# Patient Record
Sex: Male | Born: 1935 | Race: White | Hispanic: No | Marital: Married | Smoking: Never smoker
Health system: Southern US, Community
[De-identification: ages and names within clinical notes are randomized; demographics above are authoritative.]

## PROBLEM LIST (undated history)

## (undated) DIAGNOSIS — L729 Follicular cyst of the skin and subcutaneous tissue, unspecified: Principal | ICD-10-CM

## (undated) DIAGNOSIS — D72820 Lymphocytosis (symptomatic): Principal | ICD-10-CM

## (undated) DIAGNOSIS — L089 Local infection of the skin and subcutaneous tissue, unspecified: Secondary | ICD-10-CM

## (undated) HISTORY — DX: Lymphocytosis (symptomatic): D72.820

## (undated) HISTORY — DX: Follicular cyst of the skin and subcutaneous tissue, unspecified: L72.9

## (undated) HISTORY — PX: COLONOSCOPY: SHX174

## (undated) HISTORY — DX: Local infection of the skin and subcutaneous tissue, unspecified: L08.9

---

## 2012-05-25 DIAGNOSIS — J3489 Other specified disorders of nose and nasal sinuses: Secondary | ICD-10-CM | POA: Diagnosis not present

## 2012-12-14 DIAGNOSIS — Z23 Encounter for immunization: Secondary | ICD-10-CM | POA: Diagnosis not present

## 2012-12-14 DIAGNOSIS — Z131 Encounter for screening for diabetes mellitus: Secondary | ICD-10-CM | POA: Diagnosis not present

## 2012-12-14 DIAGNOSIS — Z0000 Encounter for general adult medical examination without abnormal findings: Secondary | ICD-10-CM | POA: Diagnosis not present

## 2013-04-25 DIAGNOSIS — H2510 Age-related nuclear cataract, unspecified eye: Secondary | ICD-10-CM | POA: Diagnosis not present

## 2013-11-19 DIAGNOSIS — R109 Unspecified abdominal pain: Secondary | ICD-10-CM | POA: Diagnosis not present

## 2013-11-19 DIAGNOSIS — K625 Hemorrhage of anus and rectum: Secondary | ICD-10-CM | POA: Diagnosis not present

## 2013-11-21 DIAGNOSIS — R197 Diarrhea, unspecified: Secondary | ICD-10-CM | POA: Diagnosis not present

## 2013-11-21 DIAGNOSIS — K921 Melena: Secondary | ICD-10-CM | POA: Diagnosis not present

## 2013-11-21 DIAGNOSIS — K219 Gastro-esophageal reflux disease without esophagitis: Secondary | ICD-10-CM | POA: Diagnosis not present

## 2013-11-22 DIAGNOSIS — K921 Melena: Secondary | ICD-10-CM | POA: Diagnosis not present

## 2013-11-23 DIAGNOSIS — D72829 Elevated white blood cell count, unspecified: Secondary | ICD-10-CM | POA: Diagnosis not present

## 2013-11-23 DIAGNOSIS — L723 Sebaceous cyst: Secondary | ICD-10-CM | POA: Diagnosis not present

## 2013-11-25 ENCOUNTER — Telehealth: Payer: Self-pay | Admitting: Hematology and Oncology

## 2013-11-25 NOTE — Telephone Encounter (Signed)
NEW PATIENT SCHEDULED FOR 02/24 @ 10:45 W/DR. Hope.  REFERRING DR. Harrington Challenger DX- NEW DX LYMPHOCYTOSIS.

## 2013-11-25 NOTE — Telephone Encounter (Signed)
C/D 11/25/13 for appt. 11/26/13

## 2013-11-26 ENCOUNTER — Other Ambulatory Visit (HOSPITAL_COMMUNITY)
Admission: RE | Admit: 2013-11-26 | Discharge: 2013-11-26 | Disposition: A | Discharge status: Home / Self Care | Payer: Medicare Other | Source: Ambulatory Visit | Attending: Hematology and Oncology | Admitting: Hematology and Oncology

## 2013-11-26 ENCOUNTER — Ambulatory Visit (HOSPITAL_BASED_OUTPATIENT_CLINIC_OR_DEPARTMENT_OTHER): Payer: Medicare Other

## 2013-11-26 ENCOUNTER — Ambulatory Visit (HOSPITAL_BASED_OUTPATIENT_CLINIC_OR_DEPARTMENT_OTHER): Payer: Medicare Other | Admitting: Hematology and Oncology

## 2013-11-26 ENCOUNTER — Encounter: Payer: Self-pay | Admitting: Hematology and Oncology

## 2013-11-26 ENCOUNTER — Encounter (INDEPENDENT_AMBULATORY_CARE_PROVIDER_SITE_OTHER): Payer: Self-pay

## 2013-11-26 ENCOUNTER — Telehealth: Payer: Self-pay | Admitting: *Deleted

## 2013-11-26 ENCOUNTER — Ambulatory Visit: Payer: Medicare Other

## 2013-11-26 VITALS — BP 154/86 | HR 84 | Temp 97.6°F | Resp 20 | Ht 72.0 in | Wt 205.2 lb

## 2013-11-26 DIAGNOSIS — D72820 Lymphocytosis (symptomatic): Secondary | ICD-10-CM | POA: Insufficient documentation

## 2013-11-26 DIAGNOSIS — L989 Disorder of the skin and subcutaneous tissue, unspecified: Secondary | ICD-10-CM | POA: Diagnosis not present

## 2013-11-26 DIAGNOSIS — K922 Gastrointestinal hemorrhage, unspecified: Secondary | ICD-10-CM | POA: Diagnosis not present

## 2013-11-26 DIAGNOSIS — D539 Nutritional anemia, unspecified: Secondary | ICD-10-CM

## 2013-11-26 DIAGNOSIS — C9110 Chronic lymphocytic leukemia of B-cell type not having achieved remission: Secondary | ICD-10-CM | POA: Diagnosis not present

## 2013-11-26 HISTORY — DX: Lymphocytosis (symptomatic): D72.820

## 2013-11-26 LAB — CBC & DIFF AND RETIC
BASO%: 0.3 % (ref 0.0–2.0)
Basophils Absolute: 0.1 10*3/uL (ref 0.0–0.1)
EOS ABS: 0.6 10*3/uL — AB (ref 0.0–0.5)
EOS%: 3.7 % (ref 0.0–7.0)
HCT: 32.3 % — ABNORMAL LOW (ref 38.4–49.9)
HEMOGLOBIN: 10.9 g/dL — AB (ref 13.0–17.1)
Immature Retic Fract: 18.5 % — ABNORMAL HIGH (ref 3.00–10.60)
LYMPH%: 53.7 % — ABNORMAL HIGH (ref 14.0–49.0)
MCH: 31.8 pg (ref 27.2–33.4)
MCHC: 33.7 g/dL (ref 32.0–36.0)
MCV: 94.2 fL (ref 79.3–98.0)
MONO#: 0.7 10*3/uL (ref 0.1–0.9)
MONO%: 4.3 % (ref 0.0–14.0)
NEUT#: 6.4 10*3/uL (ref 1.5–6.5)
NEUT%: 38 % — ABNORMAL LOW (ref 39.0–75.0)
PLATELETS: 337 10*3/uL (ref 140–400)
RBC: 3.43 10*6/uL — ABNORMAL LOW (ref 4.20–5.82)
RDW: 13.3 % (ref 11.0–14.6)
RETIC %: 5.15 % — AB (ref 0.80–1.80)
RETIC CT ABS: 176.65 10*3/uL — AB (ref 34.80–93.90)
WBC: 16.8 10*3/uL — ABNORMAL HIGH (ref 4.0–10.3)
lymph#: 9 10*3/uL — ABNORMAL HIGH (ref 0.9–3.3)

## 2013-11-26 LAB — COMPREHENSIVE METABOLIC PANEL (CC13)
ALBUMIN: 3.8 g/dL (ref 3.5–5.0)
ALK PHOS: 54 U/L (ref 40–150)
ALT: 16 U/L (ref 0–55)
AST: 22 U/L (ref 5–34)
Anion Gap: 10 mEq/L (ref 3–11)
BUN: 14 mg/dL (ref 7.0–26.0)
CALCIUM: 9.4 mg/dL (ref 8.4–10.4)
CO2: 27 mEq/L (ref 22–29)
Chloride: 104 mEq/L (ref 98–109)
Creatinine: 0.9 mg/dL (ref 0.7–1.3)
Glucose: 104 mg/dl (ref 70–140)
POTASSIUM: 4 meq/L (ref 3.5–5.1)
SODIUM: 141 meq/L (ref 136–145)
TOTAL PROTEIN: 6.8 g/dL (ref 6.4–8.3)
Total Bilirubin: 0.7 mg/dL (ref 0.20–1.20)

## 2013-11-26 LAB — TECHNOLOGIST REVIEW

## 2013-11-26 LAB — LACTATE DEHYDROGENASE (CC13): LDH: 177 U/L (ref 125–245)

## 2013-11-26 LAB — FERRITIN CHCC: FERRITIN: 211 ng/mL (ref 22–316)

## 2013-11-26 LAB — CHCC SMEAR

## 2013-11-26 NOTE — Progress Notes (Signed)
Checked in new pt with no financial concerns. °

## 2013-11-26 NOTE — Progress Notes (Signed)
[facility] NOTE  Patient Care Team: Melinda Crutch, MD as PCP - General (Family Medicine) Heath Lark, MD as Consulting Physician (Hematology and Oncology)  CHIEF COMPLAINTS/PURPOSE OF CONSULTATION:  Leukocytosis and anemia  HISTORY OF PRESENTING ILLNESS:  Daniel Patel 80 y.o. male is here because of elevated WBC.  He was found to have abnormal CBC from recent blood work. The patient recently have a bout of bloody diarrhea lasting several days. According to recent CBC dated 11/21/2013, white blood cell count was 18.9, hemoglobin 10.7 and normal platelet count of 236. Differential revealed significant lymphocytosis. His CBC was repeated on 11/22/2013 and on 2/21/2-15  which confirmed again anemia and leukocytosis. He denies prior history of recurrent infection or atypical infection such as shingles. The patient reported chronic symptoms of sinus congestion but denies cough, urinary frequency/urgency or dysuria, diarrhea, joint swelling/pain. Recently, he has an infected cyst on his back and is currently on antibiotics..  He had no prior history or diagnosis of cancer. His age appropriate screening programs are up-to-date. The patient has no prior diagnosis of autoimmune disease and was not prescribed corticosteroids related products.  The patient is a not a smoker.  MEDICAL HISTORY:  Past Medical History  Diagnosis Date  . Lymphocytosis 11/26/2013    SURGICAL HISTORY: Past Surgical History  Procedure Laterality Date  . Colonoscopy      SOCIAL HISTORY: History   Social History  . Marital Status: Married    Spouse Name: N/A    Number of Children: N/A  . Years of Education: N/A   Occupational History  . Not on file.   Social History Main Topics  . Smoking status: Never Smoker   . Smokeless tobacco: Never Used  . Alcohol Use: No  . Drug Use: No  . Sexual Activity: Not on file   Other Topics Concern  . Not on file   Social History Narrative  . No  narrative on file    FAMILY HISTORY: History reviewed. No pertinent family history.  ALLERGIES:  is allergic to statins and latex.  MEDICATIONS:  Current Outpatient Prescriptions  Medication Sig Dispense Refill  . doxycycline (VIBRA-TABS) 100 MG tablet Take 100 mg by mouth 2 (two) times daily.       No current facility-administered medications for this visit.    REVIEW OF SYSTEMS:   Constitutional: Denies fevers, chills or abnormal night sweats Eyes: Denies blurriness of vision, double vision or watery eyes Ears, nose, mouth, throat, and face: Denies mucositis or sore throat Respiratory: Denies cough, dyspnea or wheezes Cardiovascular: Denies palpitation, chest discomfort or lower extremity swelling Gastrointestinal:  Denies nausea, heartburn or change in bowel habits Skin: Denies abnormal skin rashes Lymphatics: Denies new lymphadenopathy or easy bruising Neurological:Denies numbness, tingling or new weaknesses Behavioral/Psych: Mood is stable, no new changes  All other systems were reviewed with the patient and are negative.  PHYSICAL EXAMINATION: ECOG PERFORMANCE STATUS: 0 - Asymptomatic  Filed Vitals:   11/26/13 1114  BP: 154/86  Pulse: 84  Temp: 97.6 F (36.4 C)  Resp: 20   Filed Weights   11/26/13 1114  Weight: 205 lb 3.2 oz (93.078 kg)    GENERAL:alert, no distress and comfortable SKIN: Significant skin lesions throughout consistent with senile keratosis. That is an infected sebaceous cyst on his back EYES: normal, conjunctiva are pink and non-injected, sclera clear OROPHARYNX:no exudate, no erythema and lips, buccal mucosa, and tongue normal  NECK: supple, thyroid normal size, non-tender, without nodularity LYMPH:  no  palpable lymphadenopathy in the cervical, axillary or inguinal LUNGS: clear to auscultation and percussion with normal breathing effort HEART: regular rate & rhythm and no murmurs and no lower extremity edema ABDOMEN:abdomen soft, non-tender  and normal bowel sounds Musculoskeletal:no cyanosis of digits and no clubbing  PSYCH: alert & oriented x 3 with fluent speech NEURO: no focal motor/sensory deficits  LABORATORY DATA:  I have reviewed the data as listed  ASSESSMENT:  Leukocytosis with lymphocytosis, suspect CLL PLAN #1 Leukocytosis #2 anemia I suspect the patient may have CLL. I will go ahead and proceed with blood work. His anemia is likely due to underlying disease. I do not think the recent GI bleed is the cause of his anemia #3 recent GI bleed I suspect this is due to diverticular bleed. He has colonoscopy scheduled #4 significant skin lesions I recommend dermatology review #5 preventive care I recommend influenza vaccination but the patient declined #6 infected sebaceous cyst The patient will continue on his current antibiotic regimen.

## 2013-11-26 NOTE — Telephone Encounter (Signed)
appts made and printed...td 

## 2013-11-27 LAB — SEDIMENTATION RATE: SED RATE: 25 mm/h — AB (ref 0–16)

## 2013-11-27 LAB — DIRECT ANTIGLOBULIN TEST (NOT AT ARMC)
DAT (COMPLEMENT): NEGATIVE
DAT IgG: NEGATIVE

## 2013-11-27 LAB — FLOW CYTOMETRY

## 2013-11-27 LAB — IGG, IGA, IGM
IgA: 84 mg/dL (ref 68–379)
IgG (Immunoglobin G), Serum: 949 mg/dL (ref 650–1600)
IgM, Serum: 29 mg/dL — ABNORMAL LOW (ref 41–251)

## 2013-11-27 LAB — VITAMIN B12: VITAMIN B 12: 543 pg/mL (ref 211–911)

## 2013-12-02 DIAGNOSIS — K921 Melena: Secondary | ICD-10-CM | POA: Diagnosis not present

## 2013-12-02 LAB — TISSUE HYBRIDIZATION TO NCBH

## 2013-12-03 LAB — FISH, PERIPHERAL BLOOD

## 2013-12-05 ENCOUNTER — Telehealth: Payer: Self-pay | Admitting: Hematology and Oncology

## 2013-12-05 NOTE — Telephone Encounter (Signed)
s.w. pt and advised that MD cx lab

## 2013-12-06 ENCOUNTER — Telehealth: Payer: Self-pay | Admitting: Hematology and Oncology

## 2013-12-06 ENCOUNTER — Encounter: Payer: Self-pay | Admitting: Hematology and Oncology

## 2013-12-06 ENCOUNTER — Other Ambulatory Visit: Payer: Medicare Other

## 2013-12-06 ENCOUNTER — Ambulatory Visit (HOSPITAL_BASED_OUTPATIENT_CLINIC_OR_DEPARTMENT_OTHER): Payer: Medicare Other | Admitting: Hematology and Oncology

## 2013-12-06 VITALS — BP 156/79 | HR 74 | Temp 98.0°F | Resp 18 | Ht 72.0 in | Wt 206.3 lb

## 2013-12-06 DIAGNOSIS — L723 Sebaceous cyst: Secondary | ICD-10-CM | POA: Diagnosis not present

## 2013-12-06 DIAGNOSIS — D539 Nutritional anemia, unspecified: Secondary | ICD-10-CM

## 2013-12-06 DIAGNOSIS — C9110 Chronic lymphocytic leukemia of B-cell type not having achieved remission: Secondary | ICD-10-CM | POA: Diagnosis not present

## 2013-12-06 DIAGNOSIS — L089 Local infection of the skin and subcutaneous tissue, unspecified: Secondary | ICD-10-CM

## 2013-12-06 DIAGNOSIS — L729 Follicular cyst of the skin and subcutaneous tissue, unspecified: Principal | ICD-10-CM

## 2013-12-06 DIAGNOSIS — D72820 Lymphocytosis (symptomatic): Secondary | ICD-10-CM

## 2013-12-06 HISTORY — DX: Local infection of the skin and subcutaneous tissue, unspecified: L08.9

## 2013-12-06 MED ORDER — CEPHALEXIN 500 MG PO CAPS: 500.0000 mg | ORAL_CAPSULE | Freq: Two times a day (BID) | ORAL | Status: DC

## 2013-12-06 NOTE — Progress Notes (Signed)
[facility] OFFICE PROGRESS NOTE  Patient Care Team: Melinda Crutch, MD as PCP - General (Family Medicine) Heath Lark, MD as Consulting Physician (Hematology and Oncology)  DIAGNOSIS: CLL with anemia, clinical stage III  SUMMARY OF ONCOLOGIC HISTORY: Oncology History   CLL, normal FISH analysis with presence of anemia, clinical RAI stage III in February 2015.     CLL (chronic lymphocytic leukemia)   11/26/2013 Procedure The patient was referred because of lymphocytosis. FLOW cytometry confirmed CLL. Preliminary staging confirmed possible stage III disease due to presence of anemia but the patient is having active infection    INTERVAL HISTORY: Daniel Patel 80 y.o. male returns for further followup. The infected cyst on his back is not improving. Infection is not healing well despite completing a course of doxycycline for 2 weeks. His wife noted occasional discharge from the skin area.  I have reviewed the past medical history, past surgical history, social history and family history with the patient and they are unchanged from previous note.  ALLERGIES:  is allergic to statins and latex.  MEDICATIONS:  Current Outpatient Prescriptions  Medication Sig Dispense Refill  . cephALEXin (KEFLEX) 500 MG capsule Take 1 capsule (500 mg total) by mouth 2 (two) times daily.  20 capsule  0   No current facility-administered medications for this visit.    REVIEW OF SYSTEMS:   Constitutional: Denies fevers, chills or abnormal weight loss Behavioral/Psych: Mood is stable, no new changes  All other systems were reviewed with the patient and are negative.  PHYSICAL EXAMINATION: ECOG PERFORMANCE STATUS: 1 - Symptomatic but completely ambulatory  Filed Vitals:   12/06/13 1103  BP: 156/79  Pulse: 74  Temp: 98 F (36.7 C)  Resp: 18   Filed Weights   12/06/13 1103  Weight: 206 lb 4.8 oz (93.577 kg)    GENERAL:alert, no distress and comfortable SKIN: skin color, texture,  turgor are normal, no rashes or significant lesions. The infected skin area resemble sebaceous cysts. No active bleeding is seen Musculoskeletal:no cyanosis of digits and no clubbing  NEURO: alert & oriented x 3 with fluent speech, no focal motor/sensory deficits  LABORATORY DATA:  I have reviewed the data as listed    Component Value Date/Time   NA 141 11/26/2013 1158   K 4.0 11/26/2013 1158   CO2 27 11/26/2013 1158   GLUCOSE 104 11/26/2013 1158   BUN 14.0 11/26/2013 1158   CREATININE 0.9 11/26/2013 1158   CALCIUM 9.4 11/26/2013 1158   PROT 6.8 11/26/2013 1158   ALBUMIN 3.8 11/26/2013 1158   AST 22 11/26/2013 1158   ALT 16 11/26/2013 1158   ALKPHOS 54 11/26/2013 1158   BILITOT 0.70 11/26/2013 1158    No results found for this basename: SPEP, UPEP,  kappa and lambda light chains    Lab Results  Component Value Date   WBC 16.8* 11/26/2013   NEUTROABS 6.4 11/26/2013   HGB 10.9* 11/26/2013   HCT 32.3* 11/26/2013   MCV 94.2 11/26/2013   PLT 337 11/26/2013      Chemistry      Component Value Date/Time   NA 141 11/26/2013 1158   K 4.0 11/26/2013 1158   CO2 27 11/26/2013 1158   BUN 14.0 11/26/2013 1158   CREATININE 0.9 11/26/2013 1158      Component Value Date/Time   CALCIUM 9.4 11/26/2013 1158   ALKPHOS 54 11/26/2013 1158   AST 22 11/26/2013 1158   ALT 16 11/26/2013 1158   BILITOT 0.70 11/26/2013 1158  ASSESSMENT & PLAN:  #1 CLL #2 mild anemia Further testing confirmed the diagnosis of CLL, intermediate risk prognosis category based on FISH. I discussed with the patient and his wife natural history of CLL. Due to presence of anemia, he would be considered stage III. However, I am not sure whether the anemia is due to active infection. I would like to see him back in my office in a few months time and repeat blood work. If the patient to have anemia, then the patient would be at stage III. However if his anemia resolved, then he would be at clinical stage 0. At present time the patient  does not require treatment. We discussed about risk of secondary malignancy, opportunistic infection, and possible progression of CLL to the point we need treatment in the future. I educated the patient and family members signs and symptoms to watch out for for possible disease progression in the future. #3 recent GI bleed The patient has colonoscopy scheduled. #4 infected sebaceous cyst I gave him prescription of Keflex to take for the next 2 weeks. It does not improve by then, he need for the incision and drainage #5 preventive care I recommend influenza vaccination but the patient declined. I recommend renewal of pneumococcal vaccination if he is more than 5 years. His wife will check the records home and we can administer pneumococcal vaccination in the next visit if is due.  Orders Placed This Encounter  Procedures  . CBC & Diff and Retic    Standing Status: Future     Number of Occurrences:      Standing Expiration Date: 12/06/2014   All questions were answered. The patient knows to call the clinic with any problems, questions or concerns. No barriers to learning was detected. I spent 30 minutes counseling the patient face to face. The total time spent in the appointment was 40 minutes and more than 50% was on counseling and review of test results     [facility], [place], MD 12/06/2013 3:11 PM

## 2013-12-06 NOTE — Patient Instructions (Signed)
Chronic Lymphocytic Leukemia Chronic lymphocytic leukemia (CLL) is a type of cancer of the bone marrow and blood cells. Bone marrow is the soft, spongy tissue inside your bone. In CLL, the bone marrow makes too many white blood cells that usually fight infection in the body (lymphocytes). CLL usually gets worse slowly and is the most common type of adult leukemia.  RISK FACTORS No one knows the exact cause of CLL. There is a higher risk of CLL in people who:   Are older than 50 years.  Are white.  Are male.  Have a family history of CLL or other cancers of the lymph system.  Are of Russian Jewish or Eastern European Jewish descent.  Have been exposed to certain chemicals, such as Agent Orange (used in the [place] War) or other herbicides or insecticides. SYMPTOMS  At first, there may be no symptoms of chronic lymphocytic leukemia. After a while, some symptoms may occur, such as:   Feeling more tired than usual, even after rest.  Unplanned weight loss.  Heavy sweating at night.  Fevers.  Shortness of breath.  Decreased energy.  Paleness.  Painless, swollen lymph nodes.  A feeling of fullness in the upper left part of the abdomen.  Easy bruising or bleeding.  More frequent infections. DIAGNOSIS  Your health care provider may perform the following exams and tests to diagnose CLL:  Physical exam to check for an enlarged spleen, liver, or lymph nodes.  Blood and bone marrow tests to identify the presence of cancer cells. These may include tests such as complete blood count, flow cytometry, immunophenotyping, and fluorescence in situ hybridization (FISH).  CT scan to look for swelling or abnormalities in your spleen, liver, and lymph nodes. TREATMENT  Treatment options for CLL depend on the stage and the presence of symptoms. There are a number of types of treatment used for this condition, including:  Observation.  Targeted drugs. These are drugs that interfere with  chemicals that leukemia cells need in order to grow and multiply. They identify and attack specific cancer cells without harming normal cells.  Chemotherapy drugs. These medicines kill cells that are multiplying quickly, such as leukemia cells.  Radiation.  Surgery to remove the spleen.  Biological therapy. This treatment boosts the ability of your own immune system to fight the leukemia cells.  Bone marrow or peripheral blood stem cell transplant. This treatment allows the patient to receive very high doses of chemotherapy and/or radiation. These high doses kill the cancer cells but also destroy the bone marrow. After treatment is complete, you are given donor bone marrow or stem cells, which will replace the bone marrow. HOME CARE INSTRUCTIONS   Because you have an increased risk of infection, practice good hand washing and avoid being around people who are ill or being in crowded places.  Because you have an increased risk of bleeding and bruising, avoid contact sports or other rough activities.  Only take over-the-counter or prescription medicines for pain, discomfort, or fever as directed by your health care provider.  Although some of your treatments might affect your appetite, try to eat regular, healthy meals.  If you develop any side effects, such as nausea, diarrhea, rash, white patches in your mouth, a sore throat, difficulty swallowing, or severe fatigue, tell your health care provider. He or she may have recommendations of things you can do to improve symptoms.  Consider learning some ways to cope with the stress of having a chronic illness, such as yoga, meditation,   or participating in a support group. SEEK MEDICAL CARE IF:  You develop chest pains.  You notice pain, swelling or redness anywhere in your legs.  You have pain in your belly (abdomen).  You develop new bruises that are getting bigger.  You have painful or more swollen lymph nodes.  You develop bleeding  from your gums, nose, or in your urine or stools.  You are unable to stop throwing up (vomiting).  You cannot keep liquids down.  You feel lightheaded.  You have a fever or persistent symptoms for more than 2 3 days.  You develop a severe stiff neck or headache. SEEK IMMEDIATE MEDICAL CARE IF:  You have trouble breathing or feel short of breath.  You faint. Document Released: 02/05/2009 Document Revised: 05/22/2013 Document Reviewed: 03/14/2013 [employer] Patient Information 2014 [employer].  

## 2013-12-06 NOTE — Telephone Encounter (Signed)
s.w. pt and advised on July appt....pt ok and aware °

## 2013-12-18 DIAGNOSIS — L03319 Cellulitis of trunk, unspecified: Secondary | ICD-10-CM | POA: Diagnosis not present

## 2013-12-18 DIAGNOSIS — Z23 Encounter for immunization: Secondary | ICD-10-CM | POA: Diagnosis not present

## 2013-12-18 DIAGNOSIS — E7800 Pure hypercholesterolemia, unspecified: Secondary | ICD-10-CM | POA: Diagnosis not present

## 2013-12-18 DIAGNOSIS — L02219 Cutaneous abscess of trunk, unspecified: Secondary | ICD-10-CM | POA: Diagnosis not present

## 2013-12-18 DIAGNOSIS — Z131 Encounter for screening for diabetes mellitus: Secondary | ICD-10-CM | POA: Diagnosis not present

## 2013-12-18 DIAGNOSIS — Z0000 Encounter for general adult medical examination without abnormal findings: Secondary | ICD-10-CM | POA: Diagnosis not present

## 2013-12-26 ENCOUNTER — Other Ambulatory Visit: Payer: Self-pay | Admitting: Gastroenterology

## 2013-12-26 DIAGNOSIS — K648 Other hemorrhoids: Secondary | ICD-10-CM | POA: Diagnosis not present

## 2013-12-26 DIAGNOSIS — R197 Diarrhea, unspecified: Secondary | ICD-10-CM | POA: Diagnosis not present

## 2013-12-26 DIAGNOSIS — K921 Melena: Secondary | ICD-10-CM | POA: Diagnosis not present

## 2013-12-26 DIAGNOSIS — D126 Benign neoplasm of colon, unspecified: Secondary | ICD-10-CM | POA: Diagnosis not present

## 2014-02-18 DIAGNOSIS — R05 Cough: Secondary | ICD-10-CM | POA: Diagnosis not present

## 2014-02-18 DIAGNOSIS — R059 Cough, unspecified: Secondary | ICD-10-CM | POA: Diagnosis not present

## 2014-02-28 DIAGNOSIS — R197 Diarrhea, unspecified: Secondary | ICD-10-CM | POA: Diagnosis not present

## 2014-03-13 ENCOUNTER — Telehealth: Payer: Self-pay | Admitting: Hematology and Oncology

## 2014-03-13 NOTE — Telephone Encounter (Signed)
s.w. pt and advised on 7.3 appt moved to 7.6 due to holiday....pt ok and aware

## 2014-04-04 ENCOUNTER — Other Ambulatory Visit: Payer: Medicare Other

## 2014-04-04 ENCOUNTER — Ambulatory Visit: Payer: Medicare Other | Admitting: Hematology and Oncology

## 2014-04-07 ENCOUNTER — Other Ambulatory Visit (HOSPITAL_BASED_OUTPATIENT_CLINIC_OR_DEPARTMENT_OTHER): Payer: Medicare Other

## 2014-04-07 ENCOUNTER — Encounter: Payer: Self-pay | Admitting: Hematology and Oncology

## 2014-04-07 ENCOUNTER — Ambulatory Visit (HOSPITAL_BASED_OUTPATIENT_CLINIC_OR_DEPARTMENT_OTHER): Payer: Medicare Other | Admitting: Hematology and Oncology

## 2014-04-07 VITALS — BP 151/90 | HR 80 | Temp 97.0°F | Resp 19 | Ht 72.0 in | Wt 205.8 lb

## 2014-04-07 DIAGNOSIS — C9110 Chronic lymphocytic leukemia of B-cell type not having achieved remission: Secondary | ICD-10-CM

## 2014-04-07 DIAGNOSIS — L729 Follicular cyst of the skin and subcutaneous tissue, unspecified: Principal | ICD-10-CM

## 2014-04-07 DIAGNOSIS — D72820 Lymphocytosis (symptomatic): Secondary | ICD-10-CM

## 2014-04-07 DIAGNOSIS — D539 Nutritional anemia, unspecified: Secondary | ICD-10-CM

## 2014-04-07 DIAGNOSIS — L089 Local infection of the skin and subcutaneous tissue, unspecified: Secondary | ICD-10-CM

## 2014-04-07 LAB — CBC & DIFF AND RETIC
BASO%: 0.5 % (ref 0.0–2.0)
BASOS ABS: 0.1 10*3/uL (ref 0.0–0.1)
EOS ABS: 0.6 10*3/uL — AB (ref 0.0–0.5)
EOS%: 4.3 % (ref 0.0–7.0)
HCT: 44.5 % (ref 38.4–49.9)
HGB: 15 g/dL (ref 13.0–17.1)
Immature Retic Fract: 4.5 % (ref 3.00–10.60)
LYMPH%: 54.9 % — ABNORMAL HIGH (ref 14.0–49.0)
MCH: 30.4 pg (ref 27.2–33.4)
MCHC: 33.7 g/dL (ref 32.0–36.0)
MCV: 90.3 fL (ref 79.3–98.0)
MONO#: 0.7 10*3/uL (ref 0.1–0.9)
MONO%: 5.3 % (ref 0.0–14.0)
NEUT%: 35 % — ABNORMAL LOW (ref 39.0–75.0)
NEUTROS ABS: 4.6 10*3/uL (ref 1.5–6.5)
PLATELETS: 211 10*3/uL (ref 140–400)
RBC: 4.93 10*6/uL (ref 4.20–5.82)
RDW: 12.8 % (ref 11.0–14.6)
RETIC CT ABS: 40.43 10*3/uL (ref 34.80–93.90)
Retic %: 0.82 % (ref 0.80–1.80)
WBC: 13.1 10*3/uL — ABNORMAL HIGH (ref 4.0–10.3)
lymph#: 7.2 10*3/uL — ABNORMAL HIGH (ref 0.9–3.3)

## 2014-04-07 LAB — TECHNOLOGIST REVIEW

## 2014-04-07 NOTE — Progress Notes (Signed)
[facility] OFFICE PROGRESS NOTE  Patient Care Team: Melinda Crutch, MD as PCP - General (Family Medicine) Heath Lark, MD as Consulting Physician (Hematology and Oncology)  SUMMARY OF ONCOLOGIC HISTORY: Oncology History   CLL, normal FISH analysis with presence of anemia, clinical RAI stage 0      CLL (chronic lymphocytic leukemia)   11/26/2013 Procedure The patient was referred because of lymphocytosis. FLOW cytometry confirmed CLL. Preliminary staging confirmed possible stage III disease due to presence of anemia but the patient is having active infection, subsequently resolved.    INTERVAL HISTORY: Please see below for problem oriented charting. He is feeling well. Denies recent infection. He has excellent energy level. Denies new lymphadenopathy. He denies any recent fever, chills, night sweats or abnormal weight loss  REVIEW OF SYSTEMS:   Constitutional: Denies fevers, chills or abnormal weight loss Eyes: Denies blurriness of vision Ears, nose, mouth, throat, and face: Denies mucositis or sore throat Respiratory: Denies cough, dyspnea or wheezes Cardiovascular: Denies palpitation, chest discomfort or lower extremity swelling Gastrointestinal:  Denies nausea, heartburn or change in bowel habits Skin: Denies abnormal skin rashes Lymphatics: Denies new lymphadenopathy or easy bruising Neurological:Denies numbness, tingling or new weaknesses Behavioral/Psych: Mood is stable, no new changes  All other systems were reviewed with the patient and are negative.  I have reviewed the past medical history, past surgical history, social history and family history with the patient and they are unchanged from previous note.  ALLERGIES:  is allergic to statins and latex.  MEDICATIONS:  Current Outpatient Prescriptions  Medication Sig Dispense Refill  . Cholecalciferol (VITAMIN D) 2000 UNITS tablet Take 2,000 Units by mouth daily.       No current facility-administered  medications for this visit.    PHYSICAL EXAMINATION: ECOG PERFORMANCE STATUS: 0 - Asymptomatic  Filed Vitals:   04/07/14 1030  BP: 151/90  Pulse: 80  Temp: 97 F (36.1 C)  Resp: 19   Filed Weights   04/07/14 1030  Weight: 205 lb 12.8 oz (93.35 kg)    GENERAL:alert, no distress and comfortable SKIN: skin color, texture, turgor are normal, no rashes or significant lesions EYES: normal, Conjunctiva are pink and non-injected, sclera clear Musculoskeletal:no cyanosis of digits and no clubbing  NEURO: alert & oriented x 3 with fluent speech, no focal motor/sensory deficits  LABORATORY DATA:  I have reviewed the data as listed    Component Value Date/Time   NA 141 11/26/2013 1158   K 4.0 11/26/2013 1158   CO2 27 11/26/2013 1158   GLUCOSE 104 11/26/2013 1158   BUN 14.0 11/26/2013 1158   CREATININE 0.9 11/26/2013 1158   CALCIUM 9.4 11/26/2013 1158   PROT 6.8 11/26/2013 1158   ALBUMIN 3.8 11/26/2013 1158   AST 22 11/26/2013 1158   ALT 16 11/26/2013 1158   ALKPHOS 54 11/26/2013 1158   BILITOT 0.70 11/26/2013 1158    No results found for this basename: SPEP, UPEP,  kappa and lambda light chains    Lab Results  Component Value Date   WBC 13.1* 04/07/2014   NEUTROABS 4.6 04/07/2014   HGB 15.0 04/07/2014   HCT 44.5 04/07/2014   MCV 90.3 04/07/2014   PLT 211 04/07/2014      Chemistry      Component Value Date/Time   NA 141 11/26/2013 1158   K 4.0 11/26/2013 1158   CO2 27 11/26/2013 1158   BUN 14.0 11/26/2013 1158   CREATININE 0.9 11/26/2013 1158      Component  Value Date/Time   CALCIUM 9.4 11/26/2013 1158   ALKPHOS 54 11/26/2013 1158   AST 22 11/26/2013 1158   ALT 16 11/26/2013 1158   BILITOT 0.70 11/26/2013 1158      ASSESSMENT & PLAN:  CLL (chronic lymphocytic leukemia) Previously, the anemia is likely due to infection. Since then, anemia has resolved. Clinically, he has stage 0 disease. I recommend history, physical examination and yearly blood work. I reinforced the importance of  vaccination programs.   Orders Placed This Encounter  Procedures  . Comprehensive metabolic panel    Standing Status: Future     Number of Occurrences:      Standing Expiration Date: 04/07/2015  . CBC with Differential    Standing Status: Future     Number of Occurrences:      Standing Expiration Date: 04/07/2015  . Lactate dehydrogenase    Standing Status: Future     Number of Occurrences:      Standing Expiration Date: 04/07/2015   All questions were answered. The patient knows to call the clinic with any problems, questions or concerns. No barriers to learning was detected. I spent 15 minutes counseling the patient face to face. The total time spent in the appointment was 20 minutes and more than 50% was on counseling and review of test results     [facility], [place], MD 04/07/2014 12:28 PM

## 2014-04-07 NOTE — Assessment & Plan Note (Signed)
Previously, the anemia is likely due to infection. Since then, anemia has resolved. Clinically, he has stage 0 disease. I recommend history, physical examination and yearly blood work. I reinforced the importance of vaccination programs.

## 2014-04-09 ENCOUNTER — Telehealth: Payer: Self-pay | Admitting: Hematology and Oncology

## 2014-04-09 NOTE — Telephone Encounter (Signed)
, °

## 2014-04-15 DIAGNOSIS — H2510 Age-related nuclear cataract, unspecified eye: Secondary | ICD-10-CM | POA: Diagnosis not present

## 2014-07-31 DIAGNOSIS — Z23 Encounter for immunization: Secondary | ICD-10-CM | POA: Diagnosis not present

## 2014-12-22 DIAGNOSIS — Z131 Encounter for screening for diabetes mellitus: Secondary | ICD-10-CM | POA: Diagnosis not present

## 2014-12-22 DIAGNOSIS — E780 Pure hypercholesterolemia: Secondary | ICD-10-CM | POA: Diagnosis not present

## 2015-01-01 DIAGNOSIS — H6123 Impacted cerumen, bilateral: Secondary | ICD-10-CM | POA: Diagnosis not present

## 2015-01-01 DIAGNOSIS — L723 Sebaceous cyst: Secondary | ICD-10-CM | POA: Diagnosis not present

## 2015-01-01 DIAGNOSIS — Z0001 Encounter for general adult medical examination with abnormal findings: Secondary | ICD-10-CM | POA: Diagnosis not present

## 2015-01-01 DIAGNOSIS — L259 Unspecified contact dermatitis, unspecified cause: Secondary | ICD-10-CM | POA: Diagnosis not present

## 2015-01-01 DIAGNOSIS — Z131 Encounter for screening for diabetes mellitus: Secondary | ICD-10-CM | POA: Diagnosis not present

## 2015-01-01 DIAGNOSIS — N529 Male erectile dysfunction, unspecified: Secondary | ICD-10-CM | POA: Diagnosis not present

## 2015-01-01 DIAGNOSIS — E780 Pure hypercholesterolemia: Secondary | ICD-10-CM | POA: Diagnosis not present

## 2015-04-10 ENCOUNTER — Ambulatory Visit: Payer: Medicare Other | Admitting: Hematology and Oncology

## 2015-04-10 ENCOUNTER — Other Ambulatory Visit: Payer: Medicare Other

## 2015-04-10 ENCOUNTER — Other Ambulatory Visit: Payer: Self-pay | Admitting: Hematology and Oncology

## 2015-04-10 DIAGNOSIS — C9110 Chronic lymphocytic leukemia of B-cell type not having achieved remission: Secondary | ICD-10-CM

## 2015-04-20 ENCOUNTER — Encounter: Payer: Self-pay | Admitting: Hematology and Oncology

## 2015-09-03 DIAGNOSIS — Z23 Encounter for immunization: Secondary | ICD-10-CM | POA: Diagnosis not present

## 2015-11-16 ENCOUNTER — Encounter (HOSPITAL_COMMUNITY): Payer: Self-pay | Admitting: Emergency Medicine

## 2015-11-16 ENCOUNTER — Emergency Department (HOSPITAL_COMMUNITY)
Admission: EM | Admit: 2015-11-16 | Discharge: 2015-11-17 | Disposition: A | Discharge status: Home / Self Care | Payer: Medicare Other | Attending: Emergency Medicine | Admitting: Emergency Medicine

## 2015-11-16 ENCOUNTER — Emergency Department (HOSPITAL_COMMUNITY): Payer: Medicare Other

## 2015-11-16 DIAGNOSIS — Z91040 Latex allergy status: Secondary | ICD-10-CM | POA: Insufficient documentation

## 2015-11-16 DIAGNOSIS — Z872 Personal history of diseases of the skin and subcutaneous tissue: Secondary | ICD-10-CM | POA: Diagnosis not present

## 2015-11-16 DIAGNOSIS — Z862 Personal history of diseases of the blood and blood-forming organs and certain disorders involving the immune mechanism: Secondary | ICD-10-CM | POA: Insufficient documentation

## 2015-11-16 DIAGNOSIS — N132 Hydronephrosis with renal and ureteral calculous obstruction: Secondary | ICD-10-CM | POA: Diagnosis not present

## 2015-11-16 DIAGNOSIS — R1031 Right lower quadrant pain: Secondary | ICD-10-CM | POA: Diagnosis not present

## 2015-11-16 DIAGNOSIS — R109 Unspecified abdominal pain: Secondary | ICD-10-CM

## 2015-11-16 DIAGNOSIS — R1084 Generalized abdominal pain: Secondary | ICD-10-CM | POA: Diagnosis not present

## 2015-11-16 DIAGNOSIS — N201 Calculus of ureter: Secondary | ICD-10-CM | POA: Diagnosis not present

## 2015-11-16 LAB — URINALYSIS, ROUTINE W REFLEX MICROSCOPIC
BILIRUBIN URINE: NEGATIVE
Glucose, UA: NEGATIVE mg/dL
HGB URINE DIPSTICK: NEGATIVE
Ketones, ur: NEGATIVE mg/dL
Leukocytes, UA: NEGATIVE
Nitrite: NEGATIVE
PH: 6 (ref 5.0–8.0)
Protein, ur: NEGATIVE mg/dL
SPECIFIC GRAVITY, URINE: 1.02 (ref 1.005–1.030)

## 2015-11-16 LAB — COMPREHENSIVE METABOLIC PANEL
ALK PHOS: 49 U/L (ref 38–126)
ALT: 16 U/L — AB (ref 17–63)
AST: 21 U/L (ref 15–41)
Albumin: 4.3 g/dL (ref 3.5–5.0)
Anion gap: 9 (ref 5–15)
BILIRUBIN TOTAL: 1.8 mg/dL — AB (ref 0.3–1.2)
BUN: 18 mg/dL (ref 6–20)
CALCIUM: 9.4 mg/dL (ref 8.9–10.3)
CHLORIDE: 103 mmol/L (ref 101–111)
CO2: 29 mmol/L (ref 22–32)
CREATININE: 1 mg/dL (ref 0.61–1.24)
Glucose, Bld: 104 mg/dL — ABNORMAL HIGH (ref 65–99)
Potassium: 4 mmol/L (ref 3.5–5.1)
Sodium: 141 mmol/L (ref 135–145)
TOTAL PROTEIN: 7.3 g/dL (ref 6.5–8.1)

## 2015-11-16 LAB — CBC
HCT: 43.1 % (ref 39.0–52.0)
Hemoglobin: 13.9 g/dL (ref 13.0–17.0)
MCH: 30.6 pg (ref 26.0–34.0)
MCHC: 32.3 g/dL (ref 30.0–36.0)
MCV: 94.9 fL (ref 78.0–100.0)
Platelets: 269 10*3/uL (ref 150–400)
RBC: 4.54 MIL/uL (ref 4.22–5.81)
RDW: 12.5 % (ref 11.5–15.5)
WBC: 17.6 10*3/uL — AB (ref 4.0–10.5)

## 2015-11-16 LAB — LIPASE, BLOOD: LIPASE: 38 U/L (ref 11–51)

## 2015-11-16 MED ORDER — ONDANSETRON HCL 4 MG/2ML IJ SOLN
4.0000 mg | Freq: Once | INTRAMUSCULAR | Status: AC
  Administered 2015-11-16: 4 mg via INTRAVENOUS
  Filled 2015-11-16: qty 2

## 2015-11-16 NOTE — ED Provider Notes (Signed)
CSN: [id number]     Arrival date & time 11/16/15  1850 History   First MD Initiated Contact with Patient 11/16/15 2235     Chief Complaint  Patient presents with  . Abdominal Pain     (Consider location/radiation/quality/duration/timing/severity/associated sxs/prior Treatment) Patient is a 82 y.o. male presenting with abdominal pain. The history is provided by the patient and medical records.  Abdominal Pain Associated symptoms: nausea and vomiting     82 year old male with history of lymphocytosis, presenting to the ED for abdominal pain. Patient states symptoms began with multiple episodes of nonbloody, nonbilious emesis days ago. He has since developed intermittent sharp, cramping pains in his right lower quadrant. States pain will last a few minutes at a time.  He does get nauseated sometimes when pain occurs, however no further vomiting.  He states he was able to eat clear liquid diet so went to his PCP for follow-up today. He had some blood work done which showed a elevated white count at 20,000. He had negative KUB and was sent here for further evaluation with concern of appendicitis. Patient has no prior history of abdominal surgeries. He did have a small bowel movement prior to arrival in the ED which was nonbloody, slightly loose. He has no other ongoing medical problems. He denies any chest pain or shortness of breath. No fever or chills. No urinary symptoms.  Patient denies current pain on arrival to ED.  VSS.  Past Medical History  Diagnosis Date  . Lymphocytosis 11/26/2013  . Infected cyst of skin 12/06/2013   Past Surgical History  Procedure Laterality Date  . Colonoscopy     History reviewed. No pertinent family history. Social History  Substance Use Topics  . Smoking status: Never Smoker   . Smokeless tobacco: Never Used  . Alcohol Use: No    Review of Systems  Gastrointestinal: Positive for nausea, vomiting and abdominal pain.  All other systems reviewed and are  negative.     Allergies  Apple; Statins; and Latex  Home Medications   Prior to Admission medications   Not on File   BP 174/92 mmHg  Pulse 81  Temp(Src) 97.3 F (36.3 C) (Oral)  Resp 20  SpO2 98%   Physical Exam  Constitutional: He is oriented to person, place, and time. He appears well-developed and well-nourished. No distress.  HENT:  Head: Normocephalic and atraumatic.  Mouth/Throat: Oropharynx is clear and moist.  Eyes: Conjunctivae and EOM are normal. Pupils are equal, round, and reactive to light.  Neck: Normal range of motion. Neck supple.  Cardiovascular: Normal rate, regular rhythm and normal heart sounds.   Pulmonary/Chest: Effort normal and breath sounds normal. No respiratory distress. He has no wheezes.  Abdominal: Soft. Bowel sounds are normal. There is no tenderness. There is no guarding.  Abdomen soft, non-tender Endorses intermittent, sharp pains to RLQ at times, none currently  Musculoskeletal: Normal range of motion. He exhibits no edema.  Neurological: He is alert and oriented to person, place, and time.  Skin: Skin is warm and dry. He is not diaphoretic.  Psychiatric: He has a normal mood and affect.  Nursing note and vitals reviewed.   ED Course  Procedures (including critical care time) Labs Review Labs Reviewed  COMPREHENSIVE METABOLIC PANEL - Abnormal; Notable for the following:    Glucose, Bld 104 (*)    ALT 16 (*)    Total Bilirubin 1.8 (*)    All other components within normal limits  CBC - Abnormal; Notable  for the following:    WBC 17.6 (*)    All other components within normal limits  LIPASE, BLOOD  URINALYSIS, ROUTINE W REFLEX MICROSCOPIC (NOT AT [facility])    Imaging Review Ct Abdomen Pelvis W Contrast  11/17/2015  CLINICAL DATA:  82 year old male with right lower quadrant abdominal pain and vomiting. EXAM: CT ABDOMEN AND PELVIS WITH CONTRAST TECHNIQUE: Multidetector CT imaging of the abdomen and pelvis was performed using the  standard protocol following bolus administration of intravenous contrast. CONTRAST:  118mL OMNIPAQUE IOHEXOL 300 MG/ML  SOLN COMPARISON:  None. FINDINGS: The visualized lung bases are clear. No intra-abdominal free air or free fluid. There is a 4.6 x 4.6 cm from the medial aspect of the right lobe of the liver. The liver is otherwise unremarkable. The gallbladder, pancreas, spleen, and the adrenal glands appear unremarkable. There is a 4 mm proximal right ureteral stone with mild right hydronephrosis. A subcentimeter right renal exophytic hypodense lesion is not well characterized but likely represents a cyst. The left kidney and urinary bladder appear unremarkable. There is mild enlargement of the prostate gland. There is no evidence of bowel obstruction or inflammation. The appendix is not visualized with certainty. No inflammatory changes identified in the right lower quadrant. Minimal aortoiliac atherosclerotic disease. The abdominal aorta and IVC are otherwise unremarkable. No portal venous gas identified. There is no adenopathy. The abdominal wall soft tissues appear unremarkable. There is degenerative changes of the spine. No acute fracture. IMPRESSION: A 4 mm proximal right ureteral calculus with mild right hydronephrosis. Electronically Signed   By: Anner Crete M.D.   On: 11/17/2015 00:48   I have personally reviewed and evaluated these images and lab results as part of my medical decision-making.   EKG Interpretation None      MDM   Final diagnoses:  Right ureteral stone  Abdominal pain, unspecified abdominal location   82 year old male sent in by PCP for further evaluation of right lower quadrant pain ongoing for the past 3 days. Patient denies any current pain on arrival to ED, states it has been intermittent. He is afebrile, nontoxic. He has no tenderness on exam. Patient does have leukocytosis at 17K, however he has hx of same when compared with prior lab values.  U/a without noted  blood or hematuria.  CT scan with evidence of 106mm right proximal ureteral stone.  This is likely source of patient's pain, however he has remained asymptomatic while here in ED, has not required any pain meds.  VS remain stable.  Will d/c home with Percocet, Zofran, and Flomax. He was given urine strainer and instructed to strain urine at home to monitor for passage of stone.  Recommended to follow-up with urology.  Discussed plan with patient, he/she acknowledged understanding and agreed with plan of care.  Return precautions given for new or worsening symptoms.  Larene Pickett, PA-C 11/17/15 0110  Lacretia Leigh, MD 11/18/15 [phone]

## 2015-11-16 NOTE — ED Notes (Signed)
Pt was sent in by PCP after RLQ x 3 days. Elevated WBC. Xrays negative. Sent for further eval. Vomiting. Alert and oriented.

## 2015-11-16 NOTE — ED Notes (Signed)
Pt, being sent by [facility], c/o abdominal pain x 3 days.  Unknown n/v/d.

## 2015-11-17 ENCOUNTER — Encounter (HOSPITAL_COMMUNITY): Payer: Self-pay

## 2015-11-17 DIAGNOSIS — N201 Calculus of ureter: Secondary | ICD-10-CM | POA: Diagnosis not present

## 2015-11-17 DIAGNOSIS — N132 Hydronephrosis with renal and ureteral calculous obstruction: Secondary | ICD-10-CM | POA: Diagnosis not present

## 2015-11-17 MED ORDER — IOHEXOL 300 MG/ML  SOLN
100.0000 mL | Freq: Once | INTRAMUSCULAR | Status: AC | PRN
  Administered 2015-11-17: 100 mL via INTRAVENOUS

## 2015-11-17 MED ORDER — OXYCODONE-ACETAMINOPHEN 5-325 MG PO TABS: 1.0000 | ORAL_TABLET | ORAL | Status: AC | PRN

## 2015-11-17 MED ORDER — ONDANSETRON 4 MG PO TBDP: 4.0000 mg | ORAL_TABLET | Freq: Three times a day (TID) | ORAL | Status: DC | PRN

## 2015-11-17 MED ORDER — TAMSULOSIN HCL 0.4 MG PO CAPS: 0.4000 mg | ORAL_CAPSULE | Freq: Every day | ORAL | Status: AC

## 2015-11-17 NOTE — Discharge Instructions (Signed)
Take the prescribed medication as directed-- take Percocet and/or Zofran as needed for your symptoms. Use caution when taking Percocet, he can make you sleepy/drowsy. Recommend to take the Flomax daily after supper. Strain urine at home to monitor for passage of stone. Be sure you're drinking plenty of water. Follow-up with urology-- call to make appointment. Return to the ED for new or worsening symptoms.

## 2016-01-04 DIAGNOSIS — R1084 Generalized abdominal pain: Secondary | ICD-10-CM | POA: Diagnosis not present

## 2016-01-04 DIAGNOSIS — Z131 Encounter for screening for diabetes mellitus: Secondary | ICD-10-CM | POA: Diagnosis not present

## 2016-01-04 DIAGNOSIS — Z0001 Encounter for general adult medical examination with abnormal findings: Secondary | ICD-10-CM | POA: Diagnosis not present

## 2016-01-04 DIAGNOSIS — N529 Male erectile dysfunction, unspecified: Secondary | ICD-10-CM | POA: Diagnosis not present

## 2016-01-04 DIAGNOSIS — E7800 Pure hypercholesterolemia, unspecified: Secondary | ICD-10-CM | POA: Diagnosis not present

## 2016-01-11 DIAGNOSIS — Z0000 Encounter for general adult medical examination without abnormal findings: Secondary | ICD-10-CM | POA: Diagnosis not present

## 2016-01-11 DIAGNOSIS — Z131 Encounter for screening for diabetes mellitus: Secondary | ICD-10-CM | POA: Diagnosis not present

## 2016-01-11 DIAGNOSIS — C9110 Chronic lymphocytic leukemia of B-cell type not having achieved remission: Secondary | ICD-10-CM | POA: Diagnosis not present

## 2016-01-11 DIAGNOSIS — E7800 Pure hypercholesterolemia, unspecified: Secondary | ICD-10-CM | POA: Diagnosis not present

## 2016-06-01 DIAGNOSIS — J45909 Unspecified asthma, uncomplicated: Secondary | ICD-10-CM | POA: Diagnosis not present

## 2016-06-20 DIAGNOSIS — H2513 Age-related nuclear cataract, bilateral: Secondary | ICD-10-CM | POA: Diagnosis not present

## 2016-07-14 DIAGNOSIS — Z23 Encounter for immunization: Secondary | ICD-10-CM | POA: Diagnosis not present

## 2016-11-05 IMAGING — CT CT ABD-PELV W/ CM
2 of 5 series · 16 of 46 positions shown, 18 images · IV contrast (100 ML OMNI 300)
Comparison: None.

CLINICAL DATA: 81-year-old male with right lower quadrant abdominal
pain and vomiting.

EXAM:
CT ABDOMEN AND PELVIS WITH CONTRAST
TECHNIQUE: Multidetector CT imaging of the abdomen and pelvis was performed
using the standard protocol following bolus administration of
intravenous contrast.
CONTRAST:  100mL OMNIPAQUE IOHEXOL 300 MG/ML  SOLN

[Series 2: abd/pel with · axial · 0.74mm/px · z∈[-490,-120]mm · 13 of 84 slices shown, 15 images]
[im 5/84  soft-tissue]
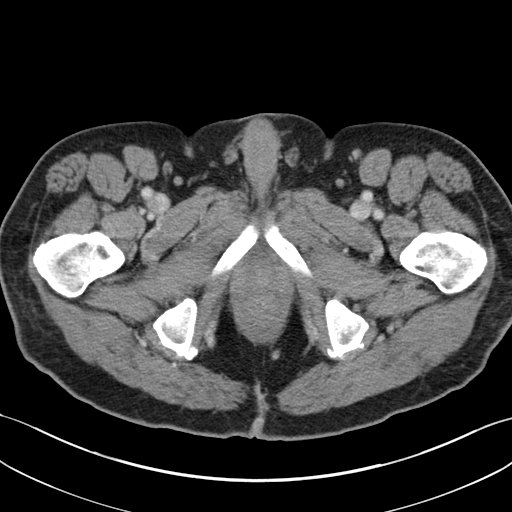
[im 5/84  bone]
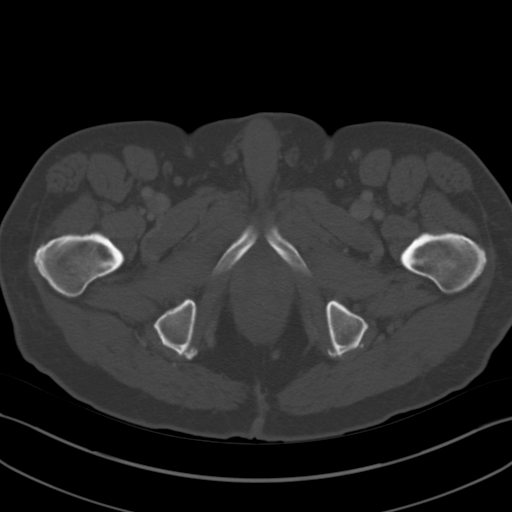
[im 10/84  soft-tissue]
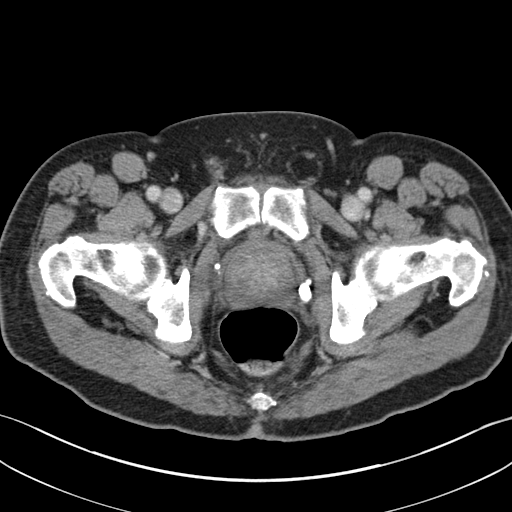
[im 19/84  soft-tissue]
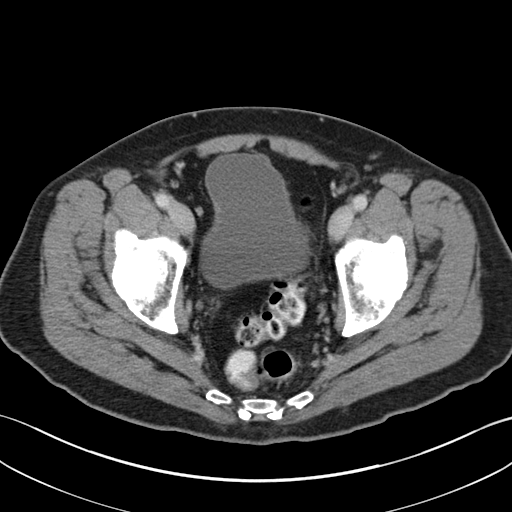
[im 24/84  soft-tissue]
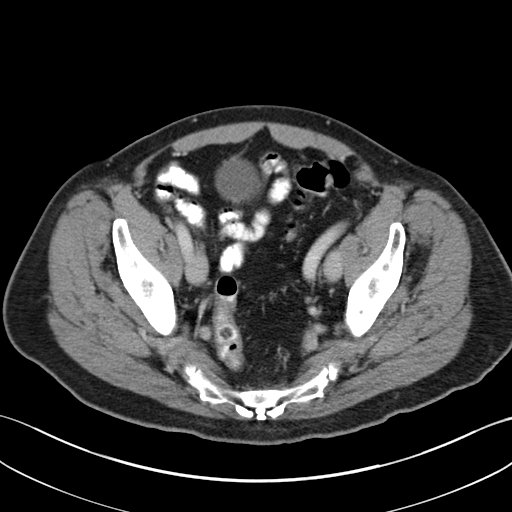
[im 28/84  soft-tissue]
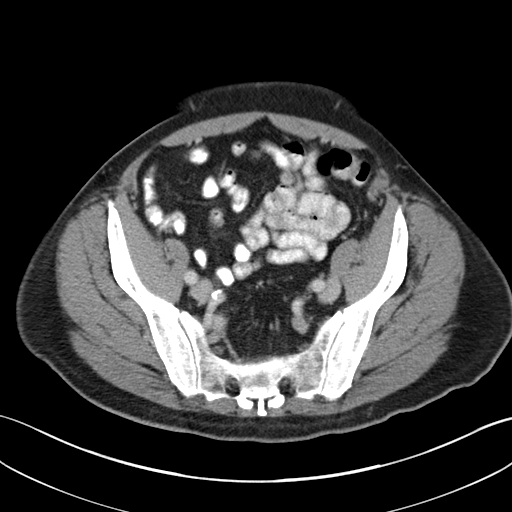
[im 37/84  soft-tissue]
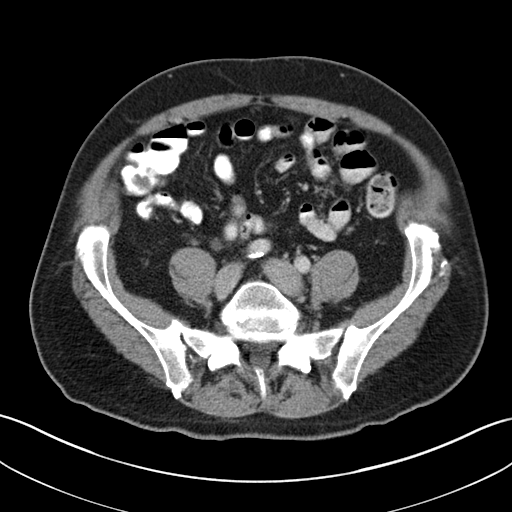
[im 42/84  soft-tissue]
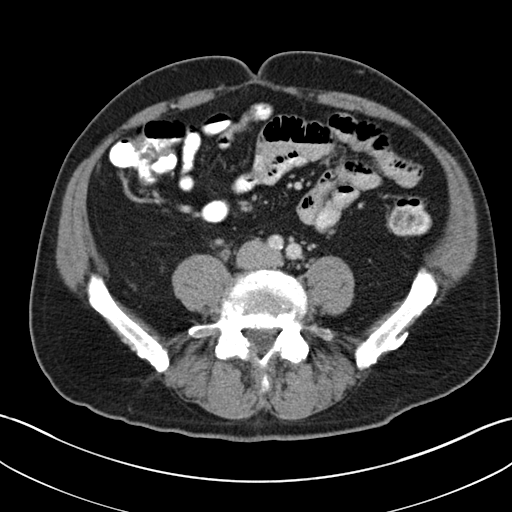
[im 47/84  soft-tissue]
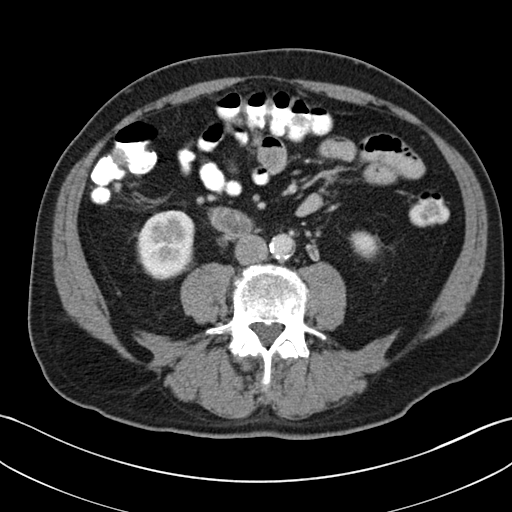
[im 56/84  soft-tissue]
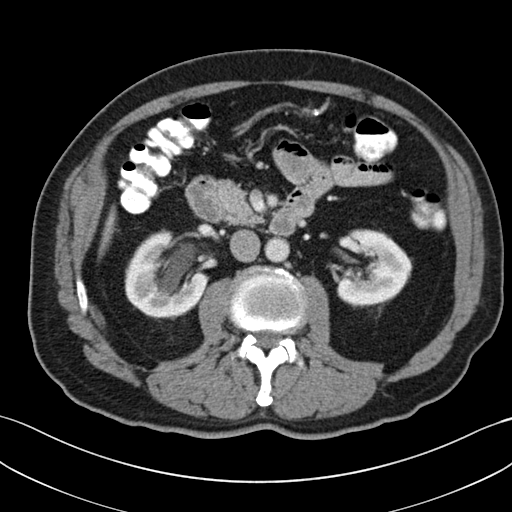
[im 56/84  bone]
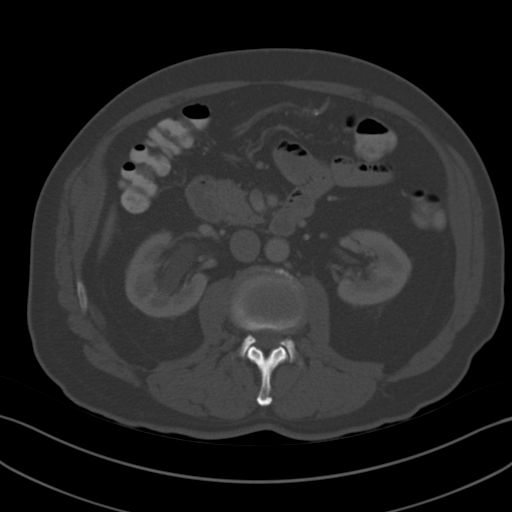
[im 60/84  soft-tissue]
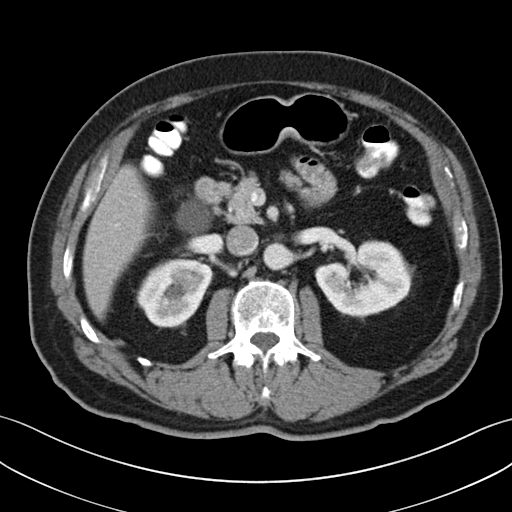
[im 65/84  soft-tissue]
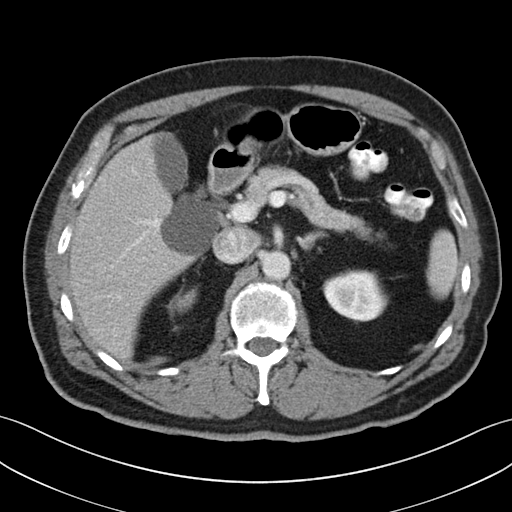
[im 74/84  soft-tissue]
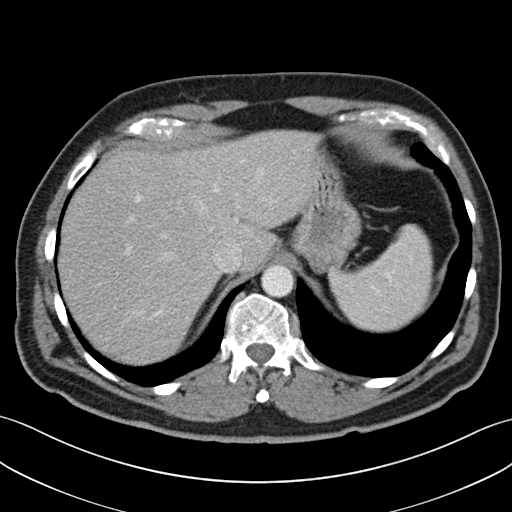
[im 79/84  soft-tissue]
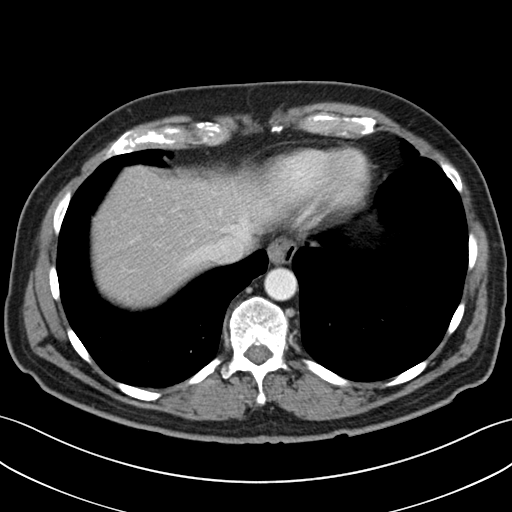

[Series 5: coronal a/|p · coronal · 0.79mm/px · 3 of 106 slices shown]
[im 36/106  soft-tissue]
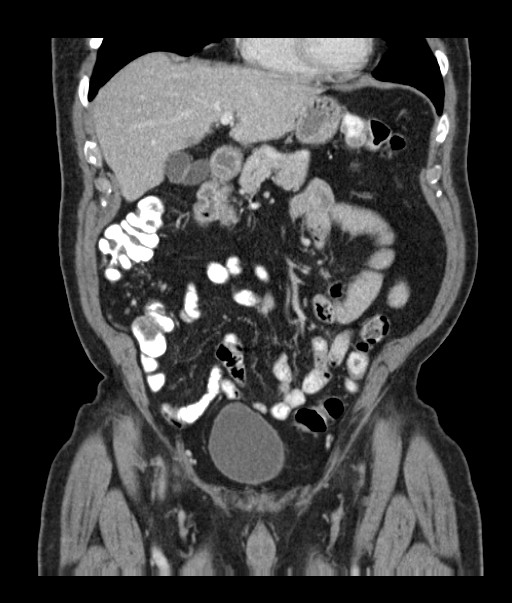
[im 47/106  soft-tissue]
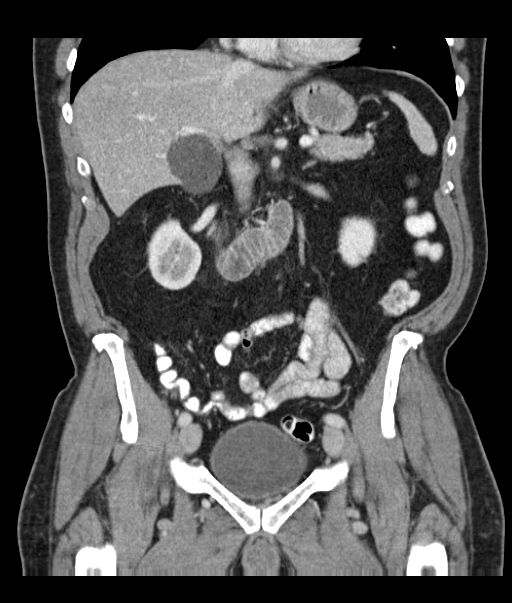
[im 59/106  soft-tissue]
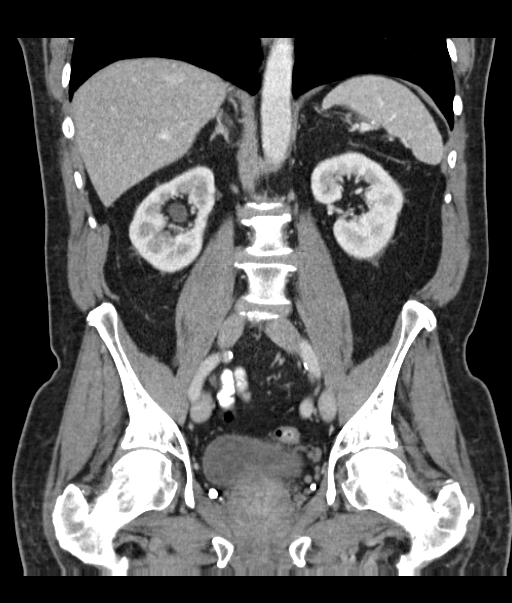

[16 of 46 positions shown; findings below may reference images not displayed]

FINDINGS: The visualized lung bases are clear. No intra-abdominal free air or
free fluid.

There is a 4.6 x 4.6 cm from the medial aspect of the right lobe of
the liver. The liver is otherwise unremarkable. The gallbladder,
pancreas, spleen, and the adrenal glands appear unremarkable. There
is a 4 mm proximal right ureteral stone with mild right
hydronephrosis. A subcentimeter right renal exophytic hypodense
lesion is not well characterized but likely represents a cyst. The
left kidney and urinary bladder appear unremarkable. There is mild
enlargement of the prostate gland.

There is no evidence of bowel obstruction or inflammation. The
appendix is not visualized with certainty. No inflammatory changes
identified in the right lower quadrant.

Minimal aortoiliac atherosclerotic disease. The abdominal aorta and
IVC are otherwise unremarkable. No portal venous gas identified.
There is no adenopathy. The abdominal wall soft tissues appear
unremarkable. There is degenerative changes of the spine. No acute
fracture.
IMPRESSION: A 4 mm proximal right ureteral calculus with mild right
hydronephrosis.

## 2017-01-25 DIAGNOSIS — Z0000 Encounter for general adult medical examination without abnormal findings: Secondary | ICD-10-CM | POA: Diagnosis not present

## 2017-01-25 DIAGNOSIS — Z131 Encounter for screening for diabetes mellitus: Secondary | ICD-10-CM | POA: Diagnosis not present

## 2017-01-25 DIAGNOSIS — C9110 Chronic lymphocytic leukemia of B-cell type not having achieved remission: Secondary | ICD-10-CM | POA: Diagnosis not present

## 2017-01-25 DIAGNOSIS — E7800 Pure hypercholesterolemia, unspecified: Secondary | ICD-10-CM | POA: Diagnosis not present

## 2017-01-30 DIAGNOSIS — C9110 Chronic lymphocytic leukemia of B-cell type not having achieved remission: Secondary | ICD-10-CM | POA: Diagnosis not present

## 2017-01-30 DIAGNOSIS — E7800 Pure hypercholesterolemia, unspecified: Secondary | ICD-10-CM | POA: Diagnosis not present

## 2017-01-30 DIAGNOSIS — Z0000 Encounter for general adult medical examination without abnormal findings: Secondary | ICD-10-CM | POA: Diagnosis not present

## 2017-01-30 DIAGNOSIS — Z131 Encounter for screening for diabetes mellitus: Secondary | ICD-10-CM | POA: Diagnosis not present

## 2017-01-30 DIAGNOSIS — J339 Nasal polyp, unspecified: Secondary | ICD-10-CM | POA: Diagnosis not present

## 2017-02-02 DIAGNOSIS — J330 Polyp of nasal cavity: Secondary | ICD-10-CM | POA: Diagnosis not present

## 2017-04-13 DIAGNOSIS — J324 Chronic pansinusitis: Secondary | ICD-10-CM | POA: Diagnosis not present

## 2017-05-30 DIAGNOSIS — H2513 Age-related nuclear cataract, bilateral: Secondary | ICD-10-CM | POA: Diagnosis not present

## 2017-07-14 DIAGNOSIS — Z23 Encounter for immunization: Secondary | ICD-10-CM | POA: Diagnosis not present

## 2018-02-15 DIAGNOSIS — Z131 Encounter for screening for diabetes mellitus: Secondary | ICD-10-CM | POA: Diagnosis not present

## 2018-02-15 DIAGNOSIS — Z0000 Encounter for general adult medical examination without abnormal findings: Secondary | ICD-10-CM | POA: Diagnosis not present

## 2018-02-15 DIAGNOSIS — E7800 Pure hypercholesterolemia, unspecified: Secondary | ICD-10-CM | POA: Diagnosis not present

## 2018-02-15 DIAGNOSIS — C9110 Chronic lymphocytic leukemia of B-cell type not having achieved remission: Secondary | ICD-10-CM | POA: Diagnosis not present

## 2018-02-15 DIAGNOSIS — J339 Nasal polyp, unspecified: Secondary | ICD-10-CM | POA: Diagnosis not present

## 2018-02-19 DIAGNOSIS — Z131 Encounter for screening for diabetes mellitus: Secondary | ICD-10-CM | POA: Diagnosis not present

## 2018-02-19 DIAGNOSIS — L723 Sebaceous cyst: Secondary | ICD-10-CM | POA: Diagnosis not present

## 2018-02-19 DIAGNOSIS — J324 Chronic pansinusitis: Secondary | ICD-10-CM | POA: Diagnosis not present

## 2018-02-19 DIAGNOSIS — E7800 Pure hypercholesterolemia, unspecified: Secondary | ICD-10-CM | POA: Diagnosis not present

## 2018-02-19 DIAGNOSIS — L0291 Cutaneous abscess, unspecified: Secondary | ICD-10-CM | POA: Diagnosis not present

## 2018-02-19 DIAGNOSIS — C9110 Chronic lymphocytic leukemia of B-cell type not having achieved remission: Secondary | ICD-10-CM | POA: Diagnosis not present

## 2018-02-19 DIAGNOSIS — Z0000 Encounter for general adult medical examination without abnormal findings: Secondary | ICD-10-CM | POA: Diagnosis not present

## 2018-05-22 DIAGNOSIS — H2513 Age-related nuclear cataract, bilateral: Secondary | ICD-10-CM | POA: Diagnosis not present

## 2018-07-17 DIAGNOSIS — Z23 Encounter for immunization: Secondary | ICD-10-CM | POA: Diagnosis not present

## 2019-03-14 DIAGNOSIS — C9110 Chronic lymphocytic leukemia of B-cell type not having achieved remission: Secondary | ICD-10-CM | POA: Diagnosis not present

## 2019-03-14 DIAGNOSIS — Z0000 Encounter for general adult medical examination without abnormal findings: Secondary | ICD-10-CM | POA: Diagnosis not present

## 2019-03-14 DIAGNOSIS — Z131 Encounter for screening for diabetes mellitus: Secondary | ICD-10-CM | POA: Diagnosis not present

## 2019-03-14 DIAGNOSIS — C4491 Basal cell carcinoma of skin, unspecified: Secondary | ICD-10-CM | POA: Diagnosis not present

## 2019-03-14 DIAGNOSIS — J324 Chronic pansinusitis: Secondary | ICD-10-CM | POA: Diagnosis not present

## 2019-03-14 DIAGNOSIS — E7800 Pure hypercholesterolemia, unspecified: Secondary | ICD-10-CM | POA: Diagnosis not present

## 2019-03-14 DIAGNOSIS — J45909 Unspecified asthma, uncomplicated: Secondary | ICD-10-CM | POA: Diagnosis not present

## 2019-07-23 DIAGNOSIS — Z23 Encounter for immunization: Secondary | ICD-10-CM | POA: Diagnosis not present

## 2019-12-31 DIAGNOSIS — R0602 Shortness of breath: Secondary | ICD-10-CM | POA: Diagnosis not present

## 2019-12-31 DIAGNOSIS — R05 Cough: Secondary | ICD-10-CM | POA: Diagnosis not present

## 2019-12-31 DIAGNOSIS — J45909 Unspecified asthma, uncomplicated: Secondary | ICD-10-CM | POA: Diagnosis not present

## 2020-07-16 ENCOUNTER — Ambulatory Visit: Payer: Medicare Other | Admitting: Physician Assistant

## 2020-12-09 DIAGNOSIS — C44219 Basal cell carcinoma of skin of left ear and external auricular canal: Secondary | ICD-10-CM | POA: Diagnosis not present

## 2021-01-12 DIAGNOSIS — Z85828 Personal history of other malignant neoplasm of skin: Secondary | ICD-10-CM | POA: Diagnosis not present

## 2021-01-12 DIAGNOSIS — Z08 Encounter for follow-up examination after completed treatment for malignant neoplasm: Secondary | ICD-10-CM | POA: Diagnosis not present

## 2021-04-01 DIAGNOSIS — C9110 Chronic lymphocytic leukemia of B-cell type not having achieved remission: Secondary | ICD-10-CM | POA: Diagnosis not present

## 2021-04-01 DIAGNOSIS — Z131 Encounter for screening for diabetes mellitus: Secondary | ICD-10-CM | POA: Diagnosis not present

## 2021-04-01 DIAGNOSIS — Z0000 Encounter for general adult medical examination without abnormal findings: Secondary | ICD-10-CM | POA: Diagnosis not present

## 2021-04-01 DIAGNOSIS — D485 Neoplasm of uncertain behavior of skin: Secondary | ICD-10-CM | POA: Diagnosis not present

## 2021-04-01 DIAGNOSIS — E7800 Pure hypercholesterolemia, unspecified: Secondary | ICD-10-CM | POA: Diagnosis not present

## 2021-04-01 DIAGNOSIS — Z23 Encounter for immunization: Secondary | ICD-10-CM | POA: Diagnosis not present

## 2021-04-01 DIAGNOSIS — J45909 Unspecified asthma, uncomplicated: Secondary | ICD-10-CM | POA: Diagnosis not present

## 2021-04-08 DIAGNOSIS — E7800 Pure hypercholesterolemia, unspecified: Secondary | ICD-10-CM | POA: Diagnosis not present

## 2021-04-08 DIAGNOSIS — C9110 Chronic lymphocytic leukemia of B-cell type not having achieved remission: Secondary | ICD-10-CM | POA: Diagnosis not present

## 2021-04-08 DIAGNOSIS — J45909 Unspecified asthma, uncomplicated: Secondary | ICD-10-CM | POA: Diagnosis not present

## 2021-04-08 DIAGNOSIS — N529 Male erectile dysfunction, unspecified: Secondary | ICD-10-CM | POA: Diagnosis not present

## 2021-04-08 DIAGNOSIS — J339 Nasal polyp, unspecified: Secondary | ICD-10-CM | POA: Diagnosis not present

## 2021-04-08 DIAGNOSIS — Z0000 Encounter for general adult medical examination without abnormal findings: Secondary | ICD-10-CM | POA: Diagnosis not present

## 2021-04-08 DIAGNOSIS — Z131 Encounter for screening for diabetes mellitus: Secondary | ICD-10-CM | POA: Diagnosis not present

## 2021-04-13 DIAGNOSIS — L821 Other seborrheic keratosis: Secondary | ICD-10-CM | POA: Diagnosis not present

## 2021-04-13 DIAGNOSIS — D225 Melanocytic nevi of trunk: Secondary | ICD-10-CM | POA: Diagnosis not present

## 2021-04-13 DIAGNOSIS — L820 Inflamed seborrheic keratosis: Secondary | ICD-10-CM | POA: Diagnosis not present

## 2021-04-13 DIAGNOSIS — Z08 Encounter for follow-up examination after completed treatment for malignant neoplasm: Secondary | ICD-10-CM | POA: Diagnosis not present

## 2021-04-13 DIAGNOSIS — Z85828 Personal history of other malignant neoplasm of skin: Secondary | ICD-10-CM | POA: Diagnosis not present

## 2021-07-23 DIAGNOSIS — L02229 Furuncle of trunk, unspecified: Secondary | ICD-10-CM | POA: Diagnosis not present

## 2021-07-23 DIAGNOSIS — B9689 Other specified bacterial agents as the cause of diseases classified elsewhere: Secondary | ICD-10-CM | POA: Diagnosis not present

## 2021-07-27 DIAGNOSIS — B9689 Other specified bacterial agents as the cause of diseases classified elsewhere: Secondary | ICD-10-CM | POA: Diagnosis not present

## 2021-07-27 DIAGNOSIS — L02229 Furuncle of trunk, unspecified: Secondary | ICD-10-CM | POA: Diagnosis not present

## 2021-08-04 DIAGNOSIS — B9689 Other specified bacterial agents as the cause of diseases classified elsewhere: Secondary | ICD-10-CM | POA: Diagnosis not present

## 2021-08-04 DIAGNOSIS — L02229 Furuncle of trunk, unspecified: Secondary | ICD-10-CM | POA: Diagnosis not present

## 2021-08-17 DIAGNOSIS — L02229 Furuncle of trunk, unspecified: Secondary | ICD-10-CM | POA: Diagnosis not present

## 2021-08-17 DIAGNOSIS — B9689 Other specified bacterial agents as the cause of diseases classified elsewhere: Secondary | ICD-10-CM | POA: Diagnosis not present

## 2021-08-31 DIAGNOSIS — B9689 Other specified bacterial agents as the cause of diseases classified elsewhere: Secondary | ICD-10-CM | POA: Diagnosis not present

## 2021-08-31 DIAGNOSIS — C4441 Basal cell carcinoma of skin of scalp and neck: Secondary | ICD-10-CM | POA: Diagnosis not present

## 2021-08-31 DIAGNOSIS — T148XXA Other injury of unspecified body region, initial encounter: Secondary | ICD-10-CM | POA: Diagnosis not present

## 2021-08-31 DIAGNOSIS — L02229 Furuncle of trunk, unspecified: Secondary | ICD-10-CM | POA: Diagnosis not present

## 2021-10-05 DIAGNOSIS — Z08 Encounter for follow-up examination after completed treatment for malignant neoplasm: Secondary | ICD-10-CM | POA: Diagnosis not present

## 2021-10-05 DIAGNOSIS — Z85828 Personal history of other malignant neoplasm of skin: Secondary | ICD-10-CM | POA: Diagnosis not present

## 2021-10-05 DIAGNOSIS — L821 Other seborrheic keratosis: Secondary | ICD-10-CM | POA: Diagnosis not present

## 2022-01-11 DIAGNOSIS — H2513 Age-related nuclear cataract, bilateral: Secondary | ICD-10-CM | POA: Diagnosis not present

## 2022-01-11 DIAGNOSIS — H40023 Open angle with borderline findings, high risk, bilateral: Secondary | ICD-10-CM | POA: Diagnosis not present

## 2022-01-11 DIAGNOSIS — H52223 Regular astigmatism, bilateral: Secondary | ICD-10-CM | POA: Diagnosis not present

## 2022-01-11 DIAGNOSIS — H1045 Other chronic allergic conjunctivitis: Secondary | ICD-10-CM | POA: Diagnosis not present

## 2022-01-11 DIAGNOSIS — H5213 Myopia, bilateral: Secondary | ICD-10-CM | POA: Diagnosis not present

## 2022-01-11 DIAGNOSIS — H524 Presbyopia: Secondary | ICD-10-CM | POA: Diagnosis not present

## 2022-04-11 DIAGNOSIS — L255 Unspecified contact dermatitis due to plants, except food: Secondary | ICD-10-CM | POA: Diagnosis not present

## 2022-04-13 DIAGNOSIS — C9110 Chronic lymphocytic leukemia of B-cell type not having achieved remission: Secondary | ICD-10-CM | POA: Diagnosis not present

## 2022-04-13 DIAGNOSIS — Z131 Encounter for screening for diabetes mellitus: Secondary | ICD-10-CM | POA: Diagnosis not present

## 2022-04-13 DIAGNOSIS — E7800 Pure hypercholesterolemia, unspecified: Secondary | ICD-10-CM | POA: Diagnosis not present

## 2022-04-19 DIAGNOSIS — N400 Enlarged prostate without lower urinary tract symptoms: Secondary | ICD-10-CM | POA: Diagnosis not present

## 2022-04-19 DIAGNOSIS — E7800 Pure hypercholesterolemia, unspecified: Secondary | ICD-10-CM | POA: Diagnosis not present

## 2022-04-19 DIAGNOSIS — C9110 Chronic lymphocytic leukemia of B-cell type not having achieved remission: Secondary | ICD-10-CM | POA: Diagnosis not present

## 2022-04-19 DIAGNOSIS — R21 Rash and other nonspecific skin eruption: Secondary | ICD-10-CM | POA: Diagnosis not present

## 2022-04-19 DIAGNOSIS — Z131 Encounter for screening for diabetes mellitus: Secondary | ICD-10-CM | POA: Diagnosis not present

## 2022-04-19 DIAGNOSIS — J45909 Unspecified asthma, uncomplicated: Secondary | ICD-10-CM | POA: Diagnosis not present

## 2022-04-19 DIAGNOSIS — J339 Nasal polyp, unspecified: Secondary | ICD-10-CM | POA: Diagnosis not present

## 2022-04-19 DIAGNOSIS — Z0000 Encounter for general adult medical examination without abnormal findings: Secondary | ICD-10-CM | POA: Diagnosis not present

## 2022-05-31 DIAGNOSIS — C9110 Chronic lymphocytic leukemia of B-cell type not having achieved remission: Secondary | ICD-10-CM | POA: Diagnosis not present

## 2023-03-27 ENCOUNTER — Emergency Department (HOSPITAL_BASED_OUTPATIENT_CLINIC_OR_DEPARTMENT_OTHER): Payer: Medicare Other

## 2023-03-27 ENCOUNTER — Other Ambulatory Visit: Payer: Self-pay

## 2023-03-27 ENCOUNTER — Encounter (HOSPITAL_BASED_OUTPATIENT_CLINIC_OR_DEPARTMENT_OTHER): Payer: Self-pay | Admitting: Emergency Medicine

## 2023-03-27 ENCOUNTER — Emergency Department (HOSPITAL_BASED_OUTPATIENT_CLINIC_OR_DEPARTMENT_OTHER)
Admission: EM | Admit: 2023-03-27 | Discharge: 2023-03-28 | Disposition: A | Discharge status: Home / Self Care | Payer: Medicare Other | Attending: Emergency Medicine | Admitting: Emergency Medicine

## 2023-03-27 DIAGNOSIS — I10 Essential (primary) hypertension: Secondary | ICD-10-CM | POA: Insufficient documentation

## 2023-03-27 DIAGNOSIS — Z1152 Encounter for screening for COVID-19: Secondary | ICD-10-CM | POA: Diagnosis not present

## 2023-03-27 DIAGNOSIS — N200 Calculus of kidney: Secondary | ICD-10-CM | POA: Diagnosis not present

## 2023-03-27 DIAGNOSIS — R1084 Generalized abdominal pain: Secondary | ICD-10-CM | POA: Insufficient documentation

## 2023-03-27 DIAGNOSIS — R0602 Shortness of breath: Secondary | ICD-10-CM | POA: Diagnosis not present

## 2023-03-27 DIAGNOSIS — K8020 Calculus of gallbladder without cholecystitis without obstruction: Secondary | ICD-10-CM | POA: Diagnosis not present

## 2023-03-27 DIAGNOSIS — K7689 Other specified diseases of liver: Secondary | ICD-10-CM | POA: Diagnosis not present

## 2023-03-27 LAB — URINALYSIS, ROUTINE W REFLEX MICROSCOPIC
Bacteria, UA: NONE SEEN
Bilirubin Urine: NEGATIVE
Glucose, UA: NEGATIVE mg/dL
Ketones, ur: NEGATIVE mg/dL
Leukocytes,Ua: NEGATIVE
Nitrite: NEGATIVE
Specific Gravity, Urine: 1.025 (ref 1.005–1.030)
pH: 6.5 (ref 5.0–8.0)

## 2023-03-27 LAB — CBC
HCT: 41.7 % (ref 39.0–52.0)
Hemoglobin: 14.2 g/dL (ref 13.0–17.0)
MCH: 32.1 pg (ref 26.0–34.0)
MCHC: 34.1 g/dL (ref 30.0–36.0)
MCV: 94.1 fL (ref 80.0–100.0)
Platelets: 245 10*3/uL (ref 150–400)
RBC: 4.43 MIL/uL (ref 4.22–5.81)
RDW: 12.5 % (ref 11.5–15.5)
WBC: 17.3 10*3/uL — ABNORMAL HIGH (ref 4.0–10.5)
nRBC: 0 % (ref 0.0–0.2)

## 2023-03-27 LAB — COMPREHENSIVE METABOLIC PANEL
ALT: 50 U/L — ABNORMAL HIGH (ref 0–44)
AST: 25 U/L (ref 15–41)
Albumin: 4 g/dL (ref 3.5–5.0)
Alkaline Phosphatase: 72 U/L (ref 38–126)
Anion gap: 11 (ref 5–15)
BUN: 18 mg/dL (ref 8–23)
CO2: 23 mmol/L (ref 22–32)
Calcium: 8.8 mg/dL — ABNORMAL LOW (ref 8.9–10.3)
Chloride: 98 mmol/L (ref 98–111)
Creatinine, Ser: 1.05 mg/dL (ref 0.61–1.24)
GFR, Estimated: >60 mL/min (ref >60)
Glucose, Bld: 133 mg/dL — ABNORMAL HIGH (ref 70–99)
Potassium: 4.1 mmol/L (ref 3.5–5.1)
Sodium: 132 mmol/L — ABNORMAL LOW (ref 135–145)
Total Bilirubin: 2.6 mg/dL — ABNORMAL HIGH (ref 0.3–1.2)
Total Protein: 6.9 g/dL (ref 6.5–8.1)

## 2023-03-27 LAB — LIPASE, BLOOD: Lipase: 19 U/L (ref 11–51)

## 2023-03-27 LAB — TROPONIN I (HIGH SENSITIVITY): Troponin I (High Sensitivity): 12 ng/L (ref <18)

## 2023-03-27 LAB — SARS CORONAVIRUS 2 BY RT PCR: SARS Coronavirus 2 by RT PCR: NEGATIVE

## 2023-03-27 MED ORDER — DICYCLOMINE HCL 10 MG PO CAPS
20.0000 mg | ORAL_CAPSULE | Freq: Once | ORAL | Status: AC
  Administered 2023-03-27: 20 mg via ORAL
  Filled 2023-03-27: qty 2

## 2023-03-27 MED ORDER — ONDANSETRON 4 MG PO TBDP: 4.0000 mg | ORAL_TABLET | Freq: Three times a day (TID) | ORAL | 0 refills | Status: AC | PRN

## 2023-03-27 MED ORDER — LACTATED RINGERS IV BOLUS
1000.0000 mL | Freq: Once | INTRAVENOUS | Status: AC
  Administered 2023-03-27: 1000 mL via INTRAVENOUS

## 2023-03-27 MED ORDER — IOHEXOL 300 MG/ML  SOLN
100.0000 mL | Freq: Once | INTRAMUSCULAR | Status: AC | PRN
  Administered 2023-03-27: 85 mL via INTRAVENOUS

## 2023-03-27 MED ORDER — FAMOTIDINE IN NACL 20-0.9 MG/50ML-% IV SOLN
20.0000 mg | Freq: Once | INTRAVENOUS | Status: AC
  Administered 2023-03-27: 20 mg via INTRAVENOUS
  Filled 2023-03-27: qty 50

## 2023-03-27 NOTE — ED Triage Notes (Signed)
Pt arrives to ED with c/o fevers, chills, urinary frequency, and abdominal tightness.

## 2023-03-27 NOTE — Discharge Instructions (Addendum)
Please take tylenol 1000mg  every 6-8 hours for the next two days. Hydrate, rest and follow up with your primary care doctor in 48-72 hours.   Return to the emergency room for any new or concerning symptoms as discussed.  Zofran for any nausea you experience.  I recommend bland foods including bananas, rice, rice, applesauce, toast, avocados

## 2023-03-27 NOTE — ED Provider Notes (Incomplete)
Daniel Patel Provider Note   CSN: [id number] Arrival date & time: 03/27/23  1459     History {Add pertinent medical, surgical, social history, OB history to HPI:1} Chief Complaint  Patient presents with  . Abdominal Pain    Daniel Patel is a 89 y.o. male.   Abdominal Pain         Home Medications Prior to Admission medications   Medication Sig Start Date End Date Taking? Authorizing Provider  ondansetron (ZOFRAN ODT) 4 MG disintegrating tablet Take 1 tablet (4 mg total) by mouth every 8 (eight) hours as needed for nausea. 11/17/15   Garlon Hatchet, PA-C  oxyCODONE-acetaminophen (PERCOCET/ROXICET) 5-325 MG tablet Take 1 tablet by mouth every 4 (four) hours as needed. 11/17/15   Garlon Hatchet, PA-C  tamsulosin (FLOMAX) 0.4 MG CAPS capsule Take 1 capsule (0.4 mg total) by mouth daily after supper. 11/17/15   Garlon Hatchet, PA-C      Allergies    Apple juice, Statins, and Latex    Review of Systems   Review of Systems  Gastrointestinal:  Positive for abdominal pain.    Physical Exam Updated Vital Signs BP (!) 148/72   Pulse 91   Temp 98.1 F (36.7 C) (Oral)   Resp 18   Ht 6' (1.829 m)   Wt 90.7 kg   SpO2 97%   BMI 27.12 kg/m  Physical Exam  ED Results / Procedures / Treatments   Labs (all labs ordered are listed, but only abnormal results are displayed) Labs Reviewed  COMPREHENSIVE METABOLIC PANEL - Abnormal; Notable for the following components:      Result Value   Sodium 132 (*)    Glucose, Bld 133 (*)    Calcium 8.8 (*)    ALT 50 (*)    Total Bilirubin 2.6 (*)    All other components within normal limits  CBC - Abnormal; Notable for the following components:   WBC 17.3 (*)    All other components within normal limits  URINALYSIS, ROUTINE W REFLEX MICROSCOPIC - Abnormal; Notable for the following components:   Hgb urine dipstick SMALL (*)    Protein, ur TRACE (*)    All other components within normal  limits  SARS CORONAVIRUS 2 BY RT PCR  LIPASE, BLOOD  TROPONIN I (HIGH SENSITIVITY)    EKG EKG Interpretation  Date/Time:  Monday March 27 2023 22:50:00 EDT Ventricular Rate:  91 PR Interval:  231 QRS Duration: 90 QT Interval:  334 QTC Calculation: 411 R Axis:   111 Text Interpretation: Right and left arm electrode reversal, interpretation assumes no reversal Sinus rhythm Prolonged PR interval Probable lateral infarct, age indeterminate Confirmed by Gwyneth Sprout ([id number]) on 03/27/2023 10:59:43 PM  Radiology US Abdomen Limited RUQ (LIVER/GB)  Result Date: 03/27/2023 CLINICAL DATA:  Right upper quadrant abdominal pain EXAM: ULTRASOUND ABDOMEN LIMITED RIGHT UPPER QUADRANT COMPARISON:  CT abdomen and pelvis dated 03/27/2023 FINDINGS: Gallbladder: No gallstones or wall thickening visualized. No sonographic Murphy sign noted by sonographer. Common bile duct: Diameter: 3 mm Liver: Caudate lobe simple cyst measures 5.0 x 4.7 cm. Within normal limits in parenchymal echogenicity. Portal vein is patent on color Doppler imaging with normal direction of blood flow towards the liver. Other: None. IMPRESSION: No sonographic findings of acute cholecystitis. Electronically Signed   By: Daniel Patel M.D.   On: 03/27/2023 20:03   CT ABDOMEN PELVIS W CONTRAST  Result Date: 03/27/2023 CLINICAL DATA:  Abdominal pain. EXAM: CT  ABDOMEN AND PELVIS WITH CONTRAST TECHNIQUE: Multidetector CT imaging of the abdomen and pelvis was performed using the standard protocol following bolus administration of intravenous contrast. RADIATION DOSE REDUCTION: This exam was performed according to the departmental dose-optimization program which includes automated exposure control, adjustment of the mA and/or kV according to patient size and/or use of iterative reconstruction technique. CONTRAST:  85mL OMNIPAQUE IOHEXOL 300 MG/ML  SOLN COMPARISON:  CT abdomen pelvis dated 11/17/2015. FINDINGS: Lower chest: Minimal bibasilar subpleural  atelectasis/scarring. There is 3 vessel coronary vascular calcification. No intra-abdominal free air or free fluid. Hepatobiliary: A 5 cm cyst in the caudate lobe of the liver not significantly changed since 2017. The liver is otherwise unremarkable. There is mild biliary dilatation. No calcified gallstone. There is mild haziness of the fat plane surrounding the gallbladder. Ultrasound may provide better evaluation of the gallbladder if there is clinical concern for acute gallbladder pathology. Pancreas: Unremarkable. No pancreatic ductal dilatation or surrounding inflammatory changes. Spleen: Normal in size without focal abnormality. Adrenals/Urinary Tract: The adrenal glands are unremarkable. There is a 2 mm nonobstructing right renal upper pole calculus. No hydronephrosis. Subcentimeter right renal upper pole hypodense lesion is too small to characterize but similar to prior CT of 2017 most consistent with a cyst. The left kidney is unremarkable. There is trabeculated appearance of the bladder wall sequela of chronic bladder outlet obstruction. Stomach/Bowel: There is no bowel obstruction or active inflammation. The appendix is normal. Vascular/Lymphatic: Mild aortoiliac atherosclerotic disease. The IVC is unremarkable. No portal venous gas. There is no adenopathy. Reproductive: Mildly enlarged prostate gland measuring 6 cm in transverse axial diameter. The seminal vesicles are symmetric. Other: Small fat containing umbilical hernia. Mildly rounded lymph node in the left groin measures approximately 11 mm in short axis, likely reactive. Clinical correlation is recommended. Musculoskeletal: Degenerative changes of the spine. No acute osseous pathology. IMPRESSION: 1. Mild haziness of the pericholecystic fat without visible calcified gallstone. Ultrasound may provide better evaluation of the gallbladder if there is clinical concern for acute gallbladder pathology. 2. No bowel obstruction. Normal appendix. 3. A 2 mm  nonobstructing right renal upper pole calculus. No hydronephrosis. 4. Mildly enlarged prostate gland with sequela of chronic bladder outlet obstruction. 5.  Aortic Atherosclerosis (ICD10-I70.0). Electronically Signed   By: Elgie Collard M.D.   On: 03/27/2023 18:58   DG Chest Portable 1 View  Result Date: 03/27/2023 CLINICAL DATA:  Shortness of breath EXAM: PORTABLE CHEST 1 VIEW COMPARISON:  None Available. FINDINGS: No consolidation, pneumothorax or effusion. Normal cardiopericardial silhouette without edema. Degenerative changes seen along the spine. IMPRESSION: No acute cardiopulmonary disease Electronically Signed   By: Karen Kays M.D.   On: 03/27/2023 18:31    Procedures Procedures  {Document cardiac monitor, telemetry assessment procedure when appropriate:1}  Medications Ordered in ED Medications  lactated ringers bolus 1,000 mL (0 mLs Intravenous Stopped 03/27/23 2010)  dicyclomine (BENTYL) capsule 20 mg (20 mg Oral Given 03/27/23 1810)  famotidine (PEPCID) IVPB 20 mg premix (0 mg Intravenous Stopped 03/27/23 1955)  iohexol (OMNIPAQUE) 300 MG/ML solution 100 mL (85 mLs Intravenous Contrast Given 03/27/23 1836)    ED Course/ Medical Decision Making/ A&P Clinical Course as of 03/27/23 2336  Mon Mar 27, 2023  1742 Temp of 103 Saturday   [WF]    Clinical Course User Index [WF] Gailen Shelter, [place]   {   Click here for ABCD2, HEART and other calculatorsREFRESH Note before signing :1}  Medical Decision Making Amount and/or Complexity of Data Reviewed Labs: ordered. Radiology: ordered.  Risk Prescription drug management.   ***  {Document critical care time when appropriate:1} {Document review of labs and clinical decision tools ie heart score, Chads2Vasc2 etc:1}  {Document your independent review of radiology images, and any outside records:1} {Document your discussion with family members, caretakers, and with consultants:1} {Document social  determinants of health affecting pt's care:1} {Document your decision making why or why not admission, treatments were needed:1} Final Clinical Impression(s) / ED Diagnoses Final diagnoses:  None    Rx / DC Orders ED Discharge Orders     None

## 2023-03-27 NOTE — ED Provider Notes (Signed)
[place] [facility] DEPARTMENT AT [facility] Provider Note   CSN: [id number] Arrival date & time: 03/27/23  1459     History  Chief Complaint  Patient presents with   Abdominal Pain    Daniel Patel is a 89 y.o. male.   Abdominal Pain  Patient is an 89 year old with past medical history significant for high cholesterol, hypertension, CLL  He resents emergency room today with complaints of some generalized abdominal pain over the past 6 days.  He states that his pain seems to be dull and mild but generalized he is having trouble localizing any particular part of his abdomen that hurts and states that he is not having a significant pain discomfort currently.  He states he also feels that he has been peeing somewhat more frequently and states that he have a fever of 103 on Saturday.  He has not had any Tylenol or ibuprofen today and is without fever.  He denies any cough congestion runny nose and denies any lightheadedness or dizziness.  Denies any rashes or skin changes no abscesses.  He denies sore throat or burning with urination.  He does not have a history of frequent UTIs.  He denies any penile discharge. He has had some loose stool     Home Medications Prior to Admission medications   Medication Sig Start Date End Date Taking? Authorizing Provider  ondansetron (ZOFRAN-ODT) 4 MG disintegrating tablet Take 1 tablet (4 mg total) by mouth every 8 (eight) hours as needed for nausea or vomiting. 03/27/23  Yes Woodard Perrell S, PA  oxyCODONE-acetaminophen (PERCOCET/ROXICET) 5-325 MG tablet Take 1 tablet by mouth every 4 (four) hours as needed. 11/17/15   Garlon Hatchet, PA-C  tamsulosin (FLOMAX) 0.4 MG CAPS capsule Take 1 capsule (0.4 mg total) by mouth daily after supper. 11/17/15   Garlon Hatchet, PA-C      Allergies    Apple juice, Statins, and Latex    Review of Systems   Review of Systems  Gastrointestinal:  Positive for abdominal pain.    Physical  Exam Updated Vital Signs BP (!) 148/72   Pulse 91   Temp 98.1 F (36.7 C) (Oral)   Resp 18   Ht 6' (1.829 m)   Wt 90.7 kg   SpO2 97%   BMI 27.12 kg/m  Physical Exam Vitals and nursing note reviewed.  Constitutional:      General: He is not in acute distress.    Comments: Patient is well-appearing 89 year old male in no acute distress.  Able answer questions appropriate follow commands, moist oral mucosa  HENT:     Head: Normocephalic and atraumatic.     Nose: Nose normal.  Eyes:     General: No scleral icterus. Cardiovascular:     Rate and Rhythm: Normal rate and regular rhythm.     Pulses: Normal pulses.     Heart sounds: Normal heart sounds.  Pulmonary:     Effort: Pulmonary effort is normal. No respiratory distress.     Breath sounds: No wheezing.  Abdominal:     Palpations: Abdomen is soft.     Tenderness: There is no abdominal tenderness.     Comments: Abdomen soft nontender.  No guarding or rebound.  Musculoskeletal:     Cervical back: Normal range of motion.     Right lower leg: No edema.     Left lower leg: No edema.  Skin:    General: Skin is warm and dry.     Capillary Refill:  Capillary refill takes less than 2 seconds.  Neurological:     Mental Status: He is alert. Mental status is at baseline.  Psychiatric:        Mood and Affect: Mood normal.        Behavior: Behavior normal.     ED Results / Procedures / Treatments   Labs (all labs ordered are listed, but only abnormal results are displayed) Labs Reviewed  COMPREHENSIVE METABOLIC PANEL - Abnormal; Notable for the following components:      Result Value   Sodium 132 (*)    Glucose, Bld 133 (*)    Calcium 8.8 (*)    ALT 50 (*)    Total Bilirubin 2.6 (*)    All other components within normal limits  CBC - Abnormal; Notable for the following components:   WBC 17.3 (*)    All other components within normal limits  URINALYSIS, ROUTINE W REFLEX MICROSCOPIC - Abnormal; Notable for the following  components:   Hgb urine dipstick SMALL (*)    Protein, ur TRACE (*)    All other components within normal limits  SARS CORONAVIRUS 2 BY RT PCR  LIPASE, BLOOD  TROPONIN I (HIGH SENSITIVITY)    EKG EKG Interpretation  Date/Time:  Monday March 27 2023 22:50:00 EDT Ventricular Rate:  91 PR Interval:  231 QRS Duration: 90 QT Interval:  334 QTC Calculation: 411 R Axis:   111 Text Interpretation: Right and left arm electrode reversal, interpretation assumes no reversal Sinus rhythm Prolonged PR interval Probable lateral infarct, age indeterminate similar to prior no STEMI Confirmed by Theda Belfast ([id number]) on 03/28/2023 5:24:42 PM  Radiology US Abdomen Limited RUQ (LIVER/GB)  Result Date: 03/27/2023 CLINICAL DATA:  Right upper quadrant abdominal pain EXAM: ULTRASOUND ABDOMEN LIMITED RIGHT UPPER QUADRANT COMPARISON:  CT abdomen and pelvis dated 03/27/2023 FINDINGS: Gallbladder: No gallstones or wall thickening visualized. No sonographic Murphy sign noted by sonographer. Common bile duct: Diameter: 3 mm Liver: Caudate lobe simple cyst measures 5.0 x 4.7 cm. Within normal limits in parenchymal echogenicity. Portal vein is patent on color Doppler imaging with normal direction of blood flow towards the liver. Other: None. IMPRESSION: No sonographic findings of acute cholecystitis. Electronically Signed   By: Agustin Cree M.D.   On: 03/27/2023 20:03   CT ABDOMEN PELVIS W CONTRAST  Result Date: 03/27/2023 CLINICAL DATA:  Abdominal pain. EXAM: CT ABDOMEN AND PELVIS WITH CONTRAST TECHNIQUE: Multidetector CT imaging of the abdomen and pelvis was performed using the standard protocol following bolus administration of intravenous contrast. RADIATION DOSE REDUCTION: This exam was performed according to the departmental dose-optimization program which includes automated exposure control, adjustment of the mA and/or kV according to patient size and/or use of iterative reconstruction technique. CONTRAST:  85mL  OMNIPAQUE IOHEXOL 300 MG/ML  SOLN COMPARISON:  CT abdomen pelvis dated 11/17/2015. FINDINGS: Lower chest: Minimal bibasilar subpleural atelectasis/scarring. There is 3 vessel coronary vascular calcification. No intra-abdominal free air or free fluid. Hepatobiliary: A 5 cm cyst in the caudate lobe of the liver not significantly changed since 2017. The liver is otherwise unremarkable. There is mild biliary dilatation. No calcified gallstone. There is mild haziness of the fat plane surrounding the gallbladder. Ultrasound may provide better evaluation of the gallbladder if there is clinical concern for acute gallbladder pathology. Pancreas: Unremarkable. No pancreatic ductal dilatation or surrounding inflammatory changes. Spleen: Normal in size without focal abnormality. Adrenals/Urinary Tract: The adrenal glands are unremarkable. There is a 2 mm nonobstructing right renal upper pole calculus.  No hydronephrosis. Subcentimeter right renal upper pole hypodense lesion is too small to characterize but similar to prior CT of 2017 most consistent with a cyst. The left kidney is unremarkable. There is trabeculated appearance of the bladder wall sequela of chronic bladder outlet obstruction. Stomach/Bowel: There is no bowel obstruction or active inflammation. The appendix is normal. Vascular/Lymphatic: Mild aortoiliac atherosclerotic disease. The IVC is unremarkable. No portal venous gas. There is no adenopathy. Reproductive: Mildly enlarged prostate gland measuring 6 cm in transverse axial diameter. The seminal vesicles are symmetric. Other: Small fat containing umbilical hernia. Mildly rounded lymph node in the left groin measures approximately 11 mm in short axis, likely reactive. Clinical correlation is recommended. Musculoskeletal: Degenerative changes of the spine. No acute osseous pathology. IMPRESSION: 1. Mild haziness of the pericholecystic fat without visible calcified gallstone. Ultrasound may provide better  evaluation of the gallbladder if there is clinical concern for acute gallbladder pathology. 2. No bowel obstruction. Normal appendix. 3. A 2 mm nonobstructing right renal upper pole calculus. No hydronephrosis. 4. Mildly enlarged prostate gland with sequela of chronic bladder outlet obstruction. 5.  Aortic Atherosclerosis (ICD10-I70.0). Electronically Signed   By: Elgie Collard M.D.   On: 03/27/2023 18:58   DG Chest Portable 1 View  Result Date: 03/27/2023 CLINICAL DATA:  Shortness of breath EXAM: PORTABLE CHEST 1 VIEW COMPARISON:  None Available. FINDINGS: No consolidation, pneumothorax or effusion. Normal cardiopericardial silhouette without edema. Degenerative changes seen along the spine. IMPRESSION: No acute cardiopulmonary disease Electronically Signed   By: Karen Kays M.D.   On: 03/27/2023 18:31    Procedures Procedures    Medications Ordered in ED Medications  lactated ringers bolus 1,000 mL (0 mLs Intravenous Stopped 03/27/23 2010)  dicyclomine (BENTYL) capsule 20 mg (20 mg Oral Given 03/27/23 1810)  famotidine (PEPCID) IVPB 20 mg premix (0 mg Intravenous Stopped 03/27/23 1955)  iohexol (OMNIPAQUE) 300 MG/ML solution 100 mL (85 mLs Intravenous Contrast Given 03/27/23 1836)    ED Course/ Medical Decision Making/ A&P Clinical Course as of 03/29/23 1223  Mon Mar 27, 2023  1742 Temp of 103 Saturday   [WF]    Clinical Course User Index [WF] Gailen Shelter, [place]                             Medical Decision Making Amount and/or Complexity of Data Reviewed Labs: ordered. Radiology: ordered.  Risk Prescription drug management.   This patient presents to the ED for concern of abd pain, this involves a number of treatment options, and is a complaint that carries with it a moderate to high risk of complications and morbidity. A differential diagnosis was considered for the patient's symptoms which is discussed below:   The causes of generalized abdominal pain include but are not  limited to AAA, mesenteric ischemia, appendicitis, diverticulitis, DKA, gastritis, gastroenteritis, AMI, nephrolithiasis, pancreatitis, peritonitis, adrenal insufficiency,lead poisoning, iron toxicity, intestinal ischemia, constipation, UTI,SBO/LBO, splenic rupture, biliary disease, IBD, IBS, PUD, or hepatitis.  Co morbidities: Discussed in HPI   Brief History:  Patient is an 89 year old with past medical history significant for high cholesterol, hypertension, CLL  He resents emergency room today with complaints of some generalized abdominal pain over the past 6 days.  He states that his pain seems to be dull and mild but generalized he is having trouble localizing any particular part of his abdomen that hurts and states that he is not having a significant pain discomfort currently.  He states he also feels that he has been peeing somewhat more frequently and states that he have a fever of 103 on Saturday.  He has not had any Tylenol or ibuprofen today and is without fever.  He denies any cough congestion runny nose and denies any lightheadedness or dizziness.  Denies any rashes or skin changes no abscesses.  He denies sore throat or burning with urination.  He does not have a history of frequent UTIs.  He denies any penile discharge. He has had some loose stool    EMR reviewed including pt PMHx, past surgical history and past visits to ER.   See HPI for more details   Lab Tests:   I ordered and independently interpreted labs. Labs notable for CMP with normal kidney function and no acute abnormal findings, COVID test negative, lipase normal, troponin within normal limits, urinalysis without evidence of infection, CBC with chronic leukocytosis likely related to his CLL  Imaging Studies:  NAD. I personally reviewed all imaging studies and no acute abnormality found. I agree with radiology interpretation.  CT abdomen pelvis without acute abnormal finding does show some haziness around  the gallbladder, follow-up ultrasound of right upper quadrant shows no evidence of cholecystitis no pericholecystic fluid or gallbladder wall thickening.  Patient is not tender in this area.  Chest x-ray without infiltrate or evidence of pneumonia no symptoms consistent with this.  Cardiac Monitoring:  The patient was maintained on a cardiac monitor.  I personally viewed and interpreted the cardiac monitored which showed an underlying rhythm of: NSR EKG non-ischemic   Medicines ordered:  I ordered medication including lactated Ringer's, Pepcid, Bentyl for abdominal pain Reevaluation of the patient after these medicines showed that the patient resolved I have reviewed the patients home medicines and have made adjustments as needed   Critical Interventions:     Consults/Attending Physician   I discussed this case with my attending physician who cosigned this note including patient's presenting symptoms, physical exam, and planned diagnostics and interventions. Attending physician stated agreement with plan or made changes to plan which were implemented.   Attending physician assessed patient at bedside.    Reevaluation:  After the interventions noted above I re-evaluated patient and found that they have :improved   Social Determinants of Health:      Problem List / ED Course:  Abdominal pain has had some diarrhea no nausea or vomiting.  Overall well-appearing on exam and did indicate that he had a fever which certainly increase my concern prompting extensive workup including CT and right upper quadrant ultrasound of abdomen.  All of this was reassuring.  Leukocytosis is chronic due to CLL.  He is tolerating p.o. well-appearing.  His bilirubin is slightly more elevated today.  He will need to have labs rechecked with primary care.  Strict return precautions were discussed with him.  My attending physician and I both evaluated patient and agree that he is safe to be discharged  home with the understanding that he will follow-up closely with primary care.  He has good support and is able to be brought back to the emergency room should he decompensate or have any other new or concerning symptoms.   Dispostion:  After consideration of the diagnostic results and the patients response to treatment, I feel that the patent would benefit from discharge   Final Clinical Impression(s) / ED Diagnoses Final diagnoses:  Generalized abdominal pain    Rx / DC Orders ED Discharge Orders  Ordered    ondansetron (ZOFRAN-ODT) 4 MG disintegrating tablet  Every 8 hours PRN        03/27/23 2341              Gailen Shelter, [place] 03/29/23 1223    Gwyneth Sprout, MD 03/31/23 2340

## 2023-04-05 DIAGNOSIS — Z09 Encounter for follow-up examination after completed treatment for conditions other than malignant neoplasm: Secondary | ICD-10-CM | POA: Diagnosis not present

## 2023-04-05 DIAGNOSIS — Z6828 Body mass index (BMI) 28.0-28.9, adult: Secondary | ICD-10-CM | POA: Diagnosis not present

## 2023-04-05 DIAGNOSIS — R197 Diarrhea, unspecified: Secondary | ICD-10-CM | POA: Diagnosis not present

## 2023-04-05 DIAGNOSIS — J45909 Unspecified asthma, uncomplicated: Secondary | ICD-10-CM | POA: Diagnosis not present

## 2023-04-05 DIAGNOSIS — R0781 Pleurodynia: Secondary | ICD-10-CM | POA: Diagnosis not present

## 2023-04-17 DIAGNOSIS — Z131 Encounter for screening for diabetes mellitus: Secondary | ICD-10-CM | POA: Diagnosis not present

## 2023-04-17 DIAGNOSIS — C9110 Chronic lymphocytic leukemia of B-cell type not having achieved remission: Secondary | ICD-10-CM | POA: Diagnosis not present

## 2023-04-17 DIAGNOSIS — E7800 Pure hypercholesterolemia, unspecified: Secondary | ICD-10-CM | POA: Diagnosis not present

## 2023-05-02 DIAGNOSIS — Z0000 Encounter for general adult medical examination without abnormal findings: Secondary | ICD-10-CM | POA: Diagnosis not present

## 2023-05-02 DIAGNOSIS — C9110 Chronic lymphocytic leukemia of B-cell type not having achieved remission: Secondary | ICD-10-CM | POA: Diagnosis not present

## 2023-05-02 DIAGNOSIS — Z6828 Body mass index (BMI) 28.0-28.9, adult: Secondary | ICD-10-CM | POA: Diagnosis not present

## 2023-05-02 DIAGNOSIS — L570 Actinic keratosis: Secondary | ICD-10-CM | POA: Diagnosis not present

## 2023-05-02 DIAGNOSIS — J45909 Unspecified asthma, uncomplicated: Secondary | ICD-10-CM | POA: Diagnosis not present

## 2023-05-02 DIAGNOSIS — R0981 Nasal congestion: Secondary | ICD-10-CM | POA: Diagnosis not present

## 2023-05-02 DIAGNOSIS — E7800 Pure hypercholesterolemia, unspecified: Secondary | ICD-10-CM | POA: Diagnosis not present

## 2023-05-02 DIAGNOSIS — R7301 Impaired fasting glucose: Secondary | ICD-10-CM | POA: Diagnosis not present

## 2023-05-31 DIAGNOSIS — D034 Melanoma in situ of scalp and neck: Secondary | ICD-10-CM | POA: Diagnosis not present

## 2023-05-31 DIAGNOSIS — I781 Nevus, non-neoplastic: Secondary | ICD-10-CM | POA: Diagnosis not present

## 2023-05-31 DIAGNOSIS — L821 Other seborrheic keratosis: Secondary | ICD-10-CM | POA: Diagnosis not present

## 2023-05-31 DIAGNOSIS — X32XXXD Exposure to sunlight, subsequent encounter: Secondary | ICD-10-CM | POA: Diagnosis not present

## 2023-05-31 DIAGNOSIS — L570 Actinic keratosis: Secondary | ICD-10-CM | POA: Diagnosis not present

## 2023-06-14 DIAGNOSIS — L988 Other specified disorders of the skin and subcutaneous tissue: Secondary | ICD-10-CM | POA: Diagnosis not present

## 2023-06-14 DIAGNOSIS — D034 Melanoma in situ of scalp and neck: Secondary | ICD-10-CM | POA: Diagnosis not present

## 2023-08-22 DIAGNOSIS — L988 Other specified disorders of the skin and subcutaneous tissue: Secondary | ICD-10-CM | POA: Diagnosis not present

## 2023-08-22 DIAGNOSIS — Z1283 Encounter for screening for malignant neoplasm of skin: Secondary | ICD-10-CM | POA: Diagnosis not present

## 2023-08-22 DIAGNOSIS — C4441 Basal cell carcinoma of skin of scalp and neck: Secondary | ICD-10-CM | POA: Diagnosis not present

## 2023-08-22 DIAGNOSIS — Z85820 Personal history of malignant melanoma of skin: Secondary | ICD-10-CM | POA: Diagnosis not present

## 2023-08-22 DIAGNOSIS — D225 Melanocytic nevi of trunk: Secondary | ICD-10-CM | POA: Diagnosis not present

## 2023-08-22 DIAGNOSIS — D485 Neoplasm of uncertain behavior of skin: Secondary | ICD-10-CM | POA: Diagnosis not present

## 2023-08-22 DIAGNOSIS — Z08 Encounter for follow-up examination after completed treatment for malignant neoplasm: Secondary | ICD-10-CM | POA: Diagnosis not present

## 2024-05-09 DIAGNOSIS — E7800 Pure hypercholesterolemia, unspecified: Secondary | ICD-10-CM | POA: Diagnosis not present

## 2024-05-09 DIAGNOSIS — C9110 Chronic lymphocytic leukemia of B-cell type not having achieved remission: Secondary | ICD-10-CM | POA: Diagnosis not present

## 2024-05-09 DIAGNOSIS — R7301 Impaired fasting glucose: Secondary | ICD-10-CM | POA: Diagnosis not present

## 2024-05-23 DIAGNOSIS — J324 Chronic pansinusitis: Secondary | ICD-10-CM | POA: Diagnosis not present

## 2024-05-23 DIAGNOSIS — C9110 Chronic lymphocytic leukemia of B-cell type not having achieved remission: Secondary | ICD-10-CM | POA: Diagnosis not present

## 2024-05-23 DIAGNOSIS — R7303 Prediabetes: Secondary | ICD-10-CM | POA: Diagnosis not present

## 2024-05-23 DIAGNOSIS — Z6828 Body mass index (BMI) 28.0-28.9, adult: Secondary | ICD-10-CM | POA: Diagnosis not present

## 2024-05-23 DIAGNOSIS — R030 Elevated blood-pressure reading, without diagnosis of hypertension: Secondary | ICD-10-CM | POA: Diagnosis not present

## 2024-05-23 DIAGNOSIS — Z23 Encounter for immunization: Secondary | ICD-10-CM | POA: Diagnosis not present

## 2024-05-23 DIAGNOSIS — Z0000 Encounter for general adult medical examination without abnormal findings: Secondary | ICD-10-CM | POA: Diagnosis not present
